# Patient Record
Sex: Female | Born: 1955 | Race: White | Hispanic: No | Marital: Married | Smoking: Never smoker
Health system: Southern US, Community
[De-identification: ages and names within clinical notes are randomized; demographics above are authoritative.]

## PROBLEM LIST (undated history)

## (undated) DIAGNOSIS — I739 Peripheral vascular disease, unspecified: Secondary | ICD-10-CM

## (undated) DIAGNOSIS — E559 Vitamin D deficiency, unspecified: Secondary | ICD-10-CM

## (undated) DIAGNOSIS — T7840XA Allergy, unspecified, initial encounter: Secondary | ICD-10-CM

## (undated) DIAGNOSIS — M8580 Other specified disorders of bone density and structure, unspecified site: Secondary | ICD-10-CM

## (undated) DIAGNOSIS — Z9889 Other specified postprocedural states: Secondary | ICD-10-CM

## (undated) DIAGNOSIS — I639 Cerebral infarction, unspecified: Secondary | ICD-10-CM

## (undated) DIAGNOSIS — E7800 Pure hypercholesterolemia, unspecified: Secondary | ICD-10-CM

## (undated) HISTORY — DX: Other specified postprocedural states: Z98.890

## (undated) HISTORY — DX: Peripheral vascular disease, unspecified: I73.9

## (undated) HISTORY — DX: Allergy, unspecified, initial encounter: T78.40XA

## (undated) HISTORY — DX: Cerebral infarction, unspecified: I63.9

## (undated) HISTORY — DX: Vitamin D deficiency, unspecified: E55.9

## (undated) HISTORY — DX: Pure hypercholesterolemia, unspecified: E78.00

## (undated) HISTORY — DX: Other specified disorders of bone density and structure, unspecified site: M85.80

---

## 2003-08-29 ENCOUNTER — Other Ambulatory Visit: Admission: RE | Admit: 2003-08-29 | Discharge: 2003-08-29 | Payer: Self-pay | Admitting: Family Medicine

## 2004-08-31 ENCOUNTER — Other Ambulatory Visit: Admission: RE | Admit: 2004-08-31 | Discharge: 2004-08-31 | Payer: Self-pay | Admitting: Family Medicine

## 2004-09-22 ENCOUNTER — Encounter: Admission: RE | Admit: 2004-09-22 | Discharge: 2004-09-22 | Payer: Self-pay | Admitting: Family Medicine

## 2004-10-06 ENCOUNTER — Encounter: Admission: RE | Admit: 2004-10-06 | Discharge: 2004-10-06 | Payer: Self-pay | Admitting: Family Medicine

## 2005-09-01 ENCOUNTER — Other Ambulatory Visit: Admission: RE | Admit: 2005-09-01 | Discharge: 2005-09-01 | Payer: Self-pay | Admitting: Family Medicine

## 2005-11-25 ENCOUNTER — Encounter: Admission: RE | Admit: 2005-11-25 | Discharge: 2005-11-25 | Payer: Self-pay | Admitting: Family Medicine

## 2006-09-22 ENCOUNTER — Other Ambulatory Visit: Admission: RE | Admit: 2006-09-22 | Discharge: 2006-09-22 | Payer: Self-pay | Admitting: Family Medicine

## 2007-01-10 ENCOUNTER — Encounter: Admission: RE | Admit: 2007-01-10 | Discharge: 2007-01-10 | Payer: Self-pay | Admitting: Family Medicine

## 2007-05-30 ENCOUNTER — Ambulatory Visit: Payer: Self-pay | Admitting: Sports Medicine

## 2007-05-30 DIAGNOSIS — M67919 Unspecified disorder of synovium and tendon, unspecified shoulder: Secondary | ICD-10-CM | POA: Insufficient documentation

## 2007-05-30 DIAGNOSIS — M719 Bursopathy, unspecified: Secondary | ICD-10-CM

## 2007-05-30 DIAGNOSIS — R109 Unspecified abdominal pain: Secondary | ICD-10-CM

## 2007-05-30 DIAGNOSIS — M25559 Pain in unspecified hip: Secondary | ICD-10-CM | POA: Insufficient documentation

## 2007-06-05 ENCOUNTER — Encounter: Admission: RE | Admit: 2007-06-05 | Discharge: 2007-06-05 | Payer: Self-pay | Admitting: Sports Medicine

## 2007-06-27 ENCOUNTER — Ambulatory Visit: Payer: Self-pay | Admitting: Sports Medicine

## 2007-10-10 ENCOUNTER — Other Ambulatory Visit: Admission: RE | Admit: 2007-10-10 | Discharge: 2007-10-10 | Payer: Self-pay | Admitting: Family Medicine

## 2007-12-12 ENCOUNTER — Encounter: Admission: RE | Admit: 2007-12-12 | Discharge: 2007-12-12 | Payer: Self-pay | Admitting: Family Medicine

## 2009-07-23 ENCOUNTER — Encounter: Admission: RE | Admit: 2009-07-23 | Discharge: 2009-07-23 | Payer: Self-pay | Admitting: Family Medicine

## 2010-01-19 ENCOUNTER — Encounter: Admission: RE | Admit: 2010-01-19 | Discharge: 2010-01-19 | Payer: Self-pay | Admitting: Orthopedic Surgery

## 2010-01-24 ENCOUNTER — Inpatient Hospital Stay (HOSPITAL_COMMUNITY): Admission: EM | Admit: 2010-01-24 | Discharge: 2010-01-26 | Payer: Self-pay | Admitting: Emergency Medicine

## 2010-01-26 ENCOUNTER — Encounter (INDEPENDENT_AMBULATORY_CARE_PROVIDER_SITE_OTHER): Payer: Self-pay | Admitting: Internal Medicine

## 2010-01-28 ENCOUNTER — Encounter
Admission: RE | Admit: 2010-01-28 | Discharge: 2010-04-06 | Payer: Self-pay | Source: Home / Self Care | Admitting: Neurology

## 2010-03-05 ENCOUNTER — Ambulatory Visit: Payer: Self-pay | Admitting: Psychology

## 2010-03-18 ENCOUNTER — Encounter: Payer: Self-pay | Admitting: Cardiology

## 2010-03-20 ENCOUNTER — Encounter: Payer: Self-pay | Admitting: Cardiology

## 2010-03-20 ENCOUNTER — Telehealth (INDEPENDENT_AMBULATORY_CARE_PROVIDER_SITE_OTHER): Payer: Self-pay | Admitting: *Deleted

## 2010-03-20 DIAGNOSIS — I63239 Cerebral infarction due to unspecified occlusion or stenosis of unspecified carotid arteries: Secondary | ICD-10-CM | POA: Insufficient documentation

## 2010-03-20 DIAGNOSIS — I669 Occlusion and stenosis of unspecified cerebral artery: Secondary | ICD-10-CM | POA: Insufficient documentation

## 2010-03-27 ENCOUNTER — Encounter: Payer: Self-pay | Admitting: Cardiology

## 2010-03-27 DIAGNOSIS — I6350 Cerebral infarction due to unspecified occlusion or stenosis of unspecified cerebral artery: Secondary | ICD-10-CM | POA: Insufficient documentation

## 2010-03-30 ENCOUNTER — Telehealth (INDEPENDENT_AMBULATORY_CARE_PROVIDER_SITE_OTHER): Payer: Self-pay | Admitting: *Deleted

## 2010-04-01 ENCOUNTER — Ambulatory Visit: Payer: Self-pay | Admitting: Cardiology

## 2010-04-07 ENCOUNTER — Ambulatory Visit (HOSPITAL_COMMUNITY): Admission: RE | Admit: 2010-04-07 | Discharge: 2010-04-07 | Payer: Self-pay | Admitting: Cardiology

## 2010-04-07 ENCOUNTER — Ambulatory Visit: Payer: Self-pay | Admitting: Cardiovascular Disease

## 2010-05-12 ENCOUNTER — Encounter: Payer: Self-pay | Admitting: Sports Medicine

## 2010-05-15 ENCOUNTER — Ambulatory Visit: Payer: Self-pay | Admitting: Sports Medicine

## 2010-05-15 DIAGNOSIS — M7500 Adhesive capsulitis of unspecified shoulder: Secondary | ICD-10-CM

## 2010-05-15 DIAGNOSIS — M25519 Pain in unspecified shoulder: Secondary | ICD-10-CM

## 2010-06-17 ENCOUNTER — Ambulatory Visit
Admission: RE | Admit: 2010-06-17 | Discharge: 2010-06-17 | Payer: Self-pay | Source: Home / Self Care | Attending: Sports Medicine | Admitting: Sports Medicine

## 2010-07-05 ENCOUNTER — Encounter: Payer: Self-pay | Admitting: Family Medicine

## 2010-07-14 NOTE — Miscellaneous (Signed)
  Clinical Lists Changes  Problems: Added new problem of CVA (ICD-434.91) Orders: Added new Referral order of Holter Monitor (Holter Monitor) - Signed

## 2010-07-14 NOTE — Progress Notes (Signed)
Summary: [facility]  [facility]   Imported By: Marily Memos 05/13/2010 11:39:19  _____________________________________________________________________  External Attachment:    Type:   Image     Comment:   External Document

## 2010-07-14 NOTE — Progress Notes (Signed)
Summary: TEE  lm to cb  Phone Note From Other Clinic   Caller: Nurse Summary of Call: Per Lucendia Herrlich Dr Pearlean Brownie pt needs a TEE scheduled. Please call pt to set this up. Also call Lucendia Herrlich with appt date and tiem [phone] x [phone]. notes to come around from Med Rec  Initial call taken by: Edman Circle,  March 20, 2010 11:02 AM  Follow-up for Phone Call        [facility] on cell# [phone] Mylo Red RN  Pt returned call ,call pt  back at [phone] TEE scheduled with Dr. 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

## 2010-07-14 NOTE — Letter (Signed)
Summary: TEE Instructions  [facility], Main Office  [address]   [place], [place] [address]   Phone: [phone]  Fax: [phone]      TEE Instructions  03/20/2010 MRN: [id number]  HOLLI RENGEL [address] [place], [place]  [address]      You are scheduled for a TEE on October 25,11 with Dr. Jens Som.  Please arrive at the [facility] at 11:00 a.m.  on the day of your procedure.  1)   Diet:     A)   Nothing to eat or drink after midnight except your medications with        a sip of water.  2)  Must have a responsible person to drive you home.  3)   Bring your current insurance cards and current list of all your medications.   *Special Note:  Every effort is made to have your procedure done on time.  Occasionally there are emergencies that present themselves at the hospital that may cause delays.  Please be patient if a delay does occur.  *If you have any questions after you get home, please call the office  at [phone].   Thank you Mylo Red RN

## 2010-07-14 NOTE — Progress Notes (Signed)
Summary: event  monitor  Phone Note Outgoing Call Call back at [facility] Phone [phone] Call back at Work Phone [phone]   Call placed by: Stanton Kidney, EMT-P,  March 30, 2010 11:45 AM Summary of Call: Pt concerned because Dr. Pearlean Brownie advised he would give her a 3 week event monitor, not a 24 hour holter monitor. Pt. advised she was not scheduling for the holter, she would call Dr. Marlis Edelson office, and hopefully fix the confusion. Stanton Kidney, EMT-P  March 30, 2010 11:47 AM  Left message at Dr. Marlis Edelson office if they want event monitor, to pls send order to [place]. Stanton Kidney, EMT-P  March 30, 2010 4:59 PM   Patient's home phone number attempted, # is disconnected. Left message on LM at work #.  Stanton Kidney, EMT-P  April 01, 2010 8:47 AM  Pt. enrolled to have event monitor mailed to pt. Stanton Kidney, EMT-P  April 06, 2010 5:10 PM

## 2010-07-14 NOTE — Procedures (Signed)
Summary: Summary Report  Summary Report   Imported By: Erle Crocker 04/30/2010 13:56:02  _____________________________________________________________________  External Attachment:    Type:   Image     Comment:   External Document

## 2010-07-14 NOTE — Consult Note (Signed)
Summary: [facility] Referral Form   [facility] Referral Form   Imported By: Roderic Ovens 04/13/2010 10:17:57  _____________________________________________________________________  External Attachment:    Type:   Image     Comment:   External Document

## 2010-07-14 NOTE — Assessment & Plan Note (Signed)
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 Now presenting with Lt shoulder pain x 2-3 months, worst with OH movement and reaching back.  Trouble putting coat on.  Did some exercises in occupational rehab, thought was helping initially, but worse over last week.  Pain with sleeping at night. Of note, had Lt sided stroke few months ago of unclear etiology, thinks she has no residual deficits, but does admit to mild diffuse weakness on Lt side.  Preventive Screening-Counseling & Management  Alcohol-Tobacco     Smoking Status: never  Allergies (verified): No Known Drug Allergies  Social History: Smoking Status:  never  Physical Exam  General:  Well-developed,well-nourished,in no acute distress; alert,appropriate and cooperative throughout examination Msk:  Lt Shoulder: Inspection reveals no abnormalities, atrophy or asymmetry. Palpation is normal with no tenderness over AC joint or bicipital groove. ROM decreased F flex 135 deg (Rt 180 deg), Abd 110 deg (Rt 170 deg), ER 45 deg (Rt 75 deg), IR to back pocket (Rt T10) Rotator cuff strength 4/5 diffusely. Grossly positive Neer and Hawkin's tests, neg empty can. Speeds and Yergason's tests normal. + mildly painful arc but no drop arm sign. Neg apprehension  neck: supple, neg Spurling's b/l   MSK Lt shoulder [place]: BT with small proximal tear and mild surrounding fluid.  Subscap, infraspin both intact.  Supraspin without tear but does have obvious calcific bursitis.  Dynamically she does impinge this bursa under acromion  AC joint without arthritis.   Impression & Recommendations:  Problem # 1:   SHOULDER PAIN, LEFT (ICD-719.41)  Has some findings c/w both RC syndrome nad AC, but will need to treat [facility] aggressively first  Orders: [place] LIMITED ([address]109) Joint Aspirate / Injection, Large (20610) Kenalog 10 mg inj (U0454)  Problem # 2:  ADHESIVE CAPSULITIS, LEFT (ICD-726.0)  H/o stroke and limited ROM make this diagnosis clear.  Discussed long time course of recovery from this with patient.  She also denies h/o DM or thyroid disease. - [facility] CSI, see procedure note below - Rest for next few days, then start ROM exercises that were given to her today, see pt instructions - Hold off on RC strength exercises for now until ROM improves - Tylenol as needed for pain (on ASA so can't take NSAIDs) - f/u 1 month  Consent obtained and verified. Sterile betadine prep. Furthur cleansed with alcohol. Topical analgesic spray: Ethyl chloride. Joint: Lt glenohumeral Approached in typical fashion with: posterior approach Completed without difficulty Meds: 1 cc 40 mg kenalog and 6 cc 1% lidocaine Needle: 23 G 1.5 inch Aftercare instructions and Red flags advised.  Orders: Joint Aspirate / Injection, Large (20610) Kenalog 10 mg inj (J3301)  Spent > 25 min with patient, over 50% spent on procedure and also on counseling regarding diagnosis, prognosis, and treatment  Patient Instructions: 1)  Do your wall crawls, pendulum exercises, and other exercises on your sheet. 2)  Please schedule a follow-up appointment in 1 month. 3)  Avoid aggrevating activities to your shoulder, but continue  to move it. 4)  Tylenol for pain as needed, no more than 4000 mg per day.   Orders Added: 1)  Est. Patient Level IV [95621] 2)  [place] LIMITED [76882] 3)  Joint Aspirate / Injection, Large [20610] 4)  Kenalog 10 mg inj [J3301]

## 2010-07-14 NOTE — Op Note (Signed)
Summary: consent  consent   Imported By: Marily Memos 05/18/2010 11:00:32  _____________________________________________________________________  External Attachment:    Type:   Image     Comment:   External Document

## 2010-07-16 NOTE — Assessment & Plan Note (Signed)
Summary: FU APPT/SHOULDER   History of Present Illness: 55 yo F her for f/u shoulder pain.  Received [facility] CSI last visit for [facility], doing 80% better.  Much easier time putting on clothes at this point.  Can lie on Lt side without pain. Occasionally gets some popping, but is non painful. Very pleased with her progress.  Allergies: No Known Drug Allergies  Physical Exam  General:  Well-developed,well-nourished,in no acute distress; alert,appropriate and cooperative throughout examination Msk:  Lt shoulder: ROM F flex 160, abd 160, ER 60 with mild painful catch at extreme of ROM, IR L3. Nl RC strength with IR/ER, mild weakness with empty can.   Impression & Recommendations:  Problem # 1:  ADHESIVE CAPSULITIS, LEFT (ICD-726.0)  Greatly improved, but still has some mild ROM deficits - see pt instructions, continue ROM exercises and add light RC strength exercises - f/u 6 weeks  Orders: Theraband per yard [phone])  Patient Instructions: 1)  Continue wall crawls and pendulums. 2)  Add some walk outs 5-10 times per day. 3)  Grab a door frame and stretch back. 4)  Do 2 sets of 10 daily +of rotator cuff exercises including internal rotation, external rotation, and thumb down raises. 5)  Follow up in 6 weeks.   Orders Added: 1)  Est. Patient Level III [60454] 2)  Theraband per yard [A9300]

## 2010-07-29 ENCOUNTER — Encounter: Payer: Self-pay | Admitting: Sports Medicine

## 2010-07-29 ENCOUNTER — Ambulatory Visit (INDEPENDENT_AMBULATORY_CARE_PROVIDER_SITE_OTHER): Payer: BC Managed Care – PPO | Admitting: Sports Medicine

## 2010-07-29 DIAGNOSIS — M7500 Adhesive capsulitis of unspecified shoulder: Secondary | ICD-10-CM

## 2010-08-05 NOTE — Assessment & Plan Note (Signed)
Summary: FU/MC/MJD   Vital Signs:  Patient profile:   55 year old female Pulse rate:   66 / minute BP sitting:   115 / 76  (right arm)  Vitals Entered By: Rochele Pages RN (July 29, 2010 9:38 AM) CC: f/u lt shoulder pain- 25% improved   CC:  f/u lt shoulder pain- 25% improved.  History of Present Illness: 55 yo F f/u Lt adhesive capsulitis.  S/p [facility] CSI 12/11.  Now 90% better.  Only mild limitation with IR.  Strength almost back to normal.  Has been doing some light theraband as well.  Very pleased. Had small setback a few weeks ago when dog jerked her on the leash, but now better.  Preventive Screening-Counseling & Management  Alcohol-Tobacco     Smoking Status: quit  Allergies: No Known Drug Allergies  Social History: Smoking Status:  quit  Physical Exam  General:  Well-developed,well-nourished,in no acute distress; alert,appropriate and cooperative throughout examination Msk:  Shoulder: Inspection reveals no abnormalities, atrophy or asymmetry. Palpation is normal with no tenderness over AC joint or bicipital groove. ROM 170 deg in f flex and abd, 60 deg ER, T10 on IR Rotator cuff strength 4+/5 (baseline given h/o stroke). No signs of impingement with negative Neer and Hawkin's tests, empty can. Speeds and Yergason's tests normal. No labral pathology noted with negative Obrien's,  No painful arc and no drop arm sign. Neurologic:  alert & oriented X3.     Impression & Recommendations:  Problem # 1:  ADHESIVE CAPSULITIS, LEFT (ICD-726.0) Assessment Improved Greatly improved - continue ROM exercises, add more IR exercises with broom stick pulls - gradually increase her theraband exercises - avoid high risk activities that stress shoulder - f/u as needed or worsening sx   Orders Added: 1)  Est. Patient Level III [04540]

## 2010-08-26 ENCOUNTER — Other Ambulatory Visit (HOSPITAL_COMMUNITY): Payer: Self-pay | Admitting: Neurology

## 2010-08-26 DIAGNOSIS — R51 Headache: Secondary | ICD-10-CM

## 2010-08-28 LAB — URINALYSIS, ROUTINE W REFLEX MICROSCOPIC
Bilirubin Urine: NEGATIVE
Glucose, UA: NEGATIVE mg/dL
Hgb urine dipstick: NEGATIVE
Ketones, ur: NEGATIVE mg/dL
Specific Gravity, Urine: 1.009 (ref 1.005–1.030)
pH: 7 (ref 5.0–8.0)

## 2010-08-28 LAB — LIPID PANEL
LDL Cholesterol: 92 mg/dL (ref 0–99)
Triglycerides: 82 mg/dL (ref <150)

## 2010-08-28 LAB — DIFFERENTIAL
Basophils Absolute: 0 10*3/uL (ref 0.0–0.1)
Basophils Relative: 0 % (ref 0–1)
Neutro Abs: 3 10*3/uL (ref 1.7–7.7)

## 2010-08-28 LAB — BASIC METABOLIC PANEL
CO2: 27 mEq/L (ref 19–32)
Calcium: 9.4 mg/dL (ref 8.4–10.5)
Chloride: 108 mEq/L (ref 96–112)
Creatinine, Ser: 0.65 mg/dL (ref 0.4–1.2)
GFR calc Af Amer: >60 mL/min (ref >60)

## 2010-08-28 LAB — CBC
HCT: 39.4 % (ref 36.0–46.0)
Hemoglobin: 13.7 g/dL (ref 12.0–15.0)
MCHC: 34.8 g/dL (ref 30.0–36.0)
RDW: 12 % (ref 11.5–15.5)

## 2010-08-28 LAB — COMPREHENSIVE METABOLIC PANEL
AST: 25 U/L (ref 0–37)
Albumin: 3.7 g/dL (ref 3.5–5.2)
Calcium: 9.2 mg/dL (ref 8.4–10.5)
Creatinine, Ser: 0.81 mg/dL (ref 0.4–1.2)
Potassium: 3.7 mEq/L (ref 3.5–5.1)
Total Protein: 6.6 g/dL (ref 6.0–8.3)

## 2010-08-28 LAB — GLUCOSE, CAPILLARY
Glucose-Capillary: 97 mg/dL (ref 70–99)
Glucose-Capillary: 109 mg/dL — ABNORMAL HIGH (ref 70–99)
Glucose-Capillary: 111 mg/dL — ABNORMAL HIGH (ref 70–99)
Glucose-Capillary: 113 mg/dL — ABNORMAL HIGH (ref 70–99)
Glucose-Capillary: 99 mg/dL (ref 70–99)

## 2010-08-28 LAB — CK TOTAL AND CKMB (NOT AT ARMC)
CK, MB: 1.3 ng/mL (ref 0.3–4.0)
Total CK: 94 U/L (ref 7–177)

## 2010-08-28 LAB — PROTIME-INR: INR: 0.99 (ref 0.00–1.49)

## 2010-09-01 ENCOUNTER — Ambulatory Visit (HOSPITAL_COMMUNITY)
Admission: RE | Admit: 2010-09-01 | Discharge: 2010-09-01 | Disposition: A | Discharge status: Home / Self Care | Payer: BC Managed Care – PPO | Source: Ambulatory Visit | Attending: Neurology | Admitting: Neurology

## 2010-09-01 ENCOUNTER — Other Ambulatory Visit (HOSPITAL_COMMUNITY): Payer: Self-pay | Admitting: Neurology

## 2010-09-01 DIAGNOSIS — R51 Headache: Secondary | ICD-10-CM

## 2010-09-01 DIAGNOSIS — I672 Cerebral atherosclerosis: Secondary | ICD-10-CM | POA: Insufficient documentation

## 2010-09-01 DIAGNOSIS — Z8673 Personal history of transient ischemic attack (TIA), and cerebral infarction without residual deficits: Secondary | ICD-10-CM | POA: Insufficient documentation

## 2010-09-01 MED ORDER — GADOBENATE DIMEGLUMINE 529 MG/ML IV SOLN
13.0000 mL | Freq: Once | INTRAVENOUS | Status: AC | PRN
  Administered 2010-09-01: 13 mL via INTRAVENOUS

## 2010-10-13 ENCOUNTER — Other Ambulatory Visit: Payer: Self-pay | Admitting: Family Medicine

## 2010-10-13 DIAGNOSIS — Z1231 Encounter for screening mammogram for malignant neoplasm of breast: Secondary | ICD-10-CM

## 2010-10-27 ENCOUNTER — Other Ambulatory Visit: Payer: Self-pay | Admitting: Family Medicine

## 2010-10-27 ENCOUNTER — Ambulatory Visit
Admission: RE | Admit: 2010-10-27 | Discharge: 2010-10-27 | Disposition: A | Discharge status: Home / Self Care | Payer: BC Managed Care – PPO | Source: Ambulatory Visit | Attending: Family Medicine | Admitting: Family Medicine

## 2010-10-27 DIAGNOSIS — N63 Unspecified lump in unspecified breast: Secondary | ICD-10-CM

## 2010-10-27 DIAGNOSIS — Z1231 Encounter for screening mammogram for malignant neoplasm of breast: Secondary | ICD-10-CM

## 2010-11-02 ENCOUNTER — Ambulatory Visit
Admission: RE | Admit: 2010-11-02 | Discharge: 2010-11-02 | Disposition: A | Discharge status: Home / Self Care | Payer: BC Managed Care – PPO | Source: Ambulatory Visit | Attending: Family Medicine | Admitting: Family Medicine

## 2010-11-02 ENCOUNTER — Other Ambulatory Visit: Payer: Self-pay | Admitting: Family Medicine

## 2010-11-02 DIAGNOSIS — N63 Unspecified lump in unspecified breast: Secondary | ICD-10-CM

## 2010-11-13 ENCOUNTER — Other Ambulatory Visit (HOSPITAL_COMMUNITY)
Admission: RE | Admit: 2010-11-13 | Discharge: 2010-11-13 | Disposition: A | Discharge status: Home / Self Care | Payer: BC Managed Care – PPO | Source: Ambulatory Visit | Attending: Family Medicine | Admitting: Family Medicine

## 2010-11-13 ENCOUNTER — Other Ambulatory Visit: Payer: Self-pay | Admitting: Family Medicine

## 2010-11-13 DIAGNOSIS — Z124 Encounter for screening for malignant neoplasm of cervix: Secondary | ICD-10-CM | POA: Insufficient documentation

## 2011-12-14 ENCOUNTER — Other Ambulatory Visit: Payer: Self-pay | Admitting: Family Medicine

## 2011-12-14 DIAGNOSIS — Z1231 Encounter for screening mammogram for malignant neoplasm of breast: Secondary | ICD-10-CM

## 2011-12-15 ENCOUNTER — Ambulatory Visit
Admission: RE | Admit: 2011-12-15 | Discharge: 2011-12-15 | Disposition: A | Discharge status: Home / Self Care | Payer: BC Managed Care – PPO | Source: Ambulatory Visit | Attending: Family Medicine | Admitting: Family Medicine

## 2011-12-15 DIAGNOSIS — Z1231 Encounter for screening mammogram for malignant neoplasm of breast: Secondary | ICD-10-CM

## 2012-02-21 ENCOUNTER — Other Ambulatory Visit: Payer: Self-pay | Admitting: Family Medicine

## 2013-01-31 ENCOUNTER — Other Ambulatory Visit: Payer: Self-pay

## 2013-01-31 DIAGNOSIS — Z1231 Encounter for screening mammogram for malignant neoplasm of breast: Secondary | ICD-10-CM

## 2013-02-28 ENCOUNTER — Ambulatory Visit
Admission: RE | Admit: 2013-02-28 | Discharge: 2013-02-28 | Disposition: A | Discharge status: Home / Self Care | Payer: BC Managed Care – PPO | Source: Ambulatory Visit

## 2013-02-28 DIAGNOSIS — Z1231 Encounter for screening mammogram for malignant neoplasm of breast: Secondary | ICD-10-CM

## 2014-02-14 ENCOUNTER — Other Ambulatory Visit (HOSPITAL_COMMUNITY)
Admission: RE | Admit: 2014-02-14 | Discharge: 2014-02-14 | Disposition: A | Discharge status: Home / Self Care | Payer: BC Managed Care – PPO | Source: Ambulatory Visit | Attending: Family Medicine | Admitting: Family Medicine

## 2014-02-14 ENCOUNTER — Other Ambulatory Visit: Payer: Self-pay | Admitting: Family Medicine

## 2014-02-14 DIAGNOSIS — Z124 Encounter for screening for malignant neoplasm of cervix: Secondary | ICD-10-CM | POA: Insufficient documentation

## 2014-02-15 LAB — CYTOLOGY - PAP

## 2015-02-26 ENCOUNTER — Other Ambulatory Visit: Payer: Self-pay

## 2015-02-26 DIAGNOSIS — Z1231 Encounter for screening mammogram for malignant neoplasm of breast: Secondary | ICD-10-CM

## 2015-03-05 ENCOUNTER — Ambulatory Visit
Admission: RE | Admit: 2015-03-05 | Discharge: 2015-03-05 | Disposition: A | Discharge status: Home / Self Care | Payer: BC Managed Care – PPO | Source: Ambulatory Visit

## 2015-03-05 DIAGNOSIS — Z1231 Encounter for screening mammogram for malignant neoplasm of breast: Secondary | ICD-10-CM

## 2016-03-30 ENCOUNTER — Other Ambulatory Visit: Payer: Self-pay | Admitting: Family Medicine

## 2016-03-30 DIAGNOSIS — Z1231 Encounter for screening mammogram for malignant neoplasm of breast: Secondary | ICD-10-CM

## 2016-04-05 ENCOUNTER — Ambulatory Visit
Admission: RE | Admit: 2016-04-05 | Discharge: 2016-04-05 | Disposition: A | Discharge status: Home / Self Care | Payer: BC Managed Care – PPO | Source: Ambulatory Visit | Attending: Family Medicine | Admitting: Family Medicine

## 2016-04-05 DIAGNOSIS — Z1231 Encounter for screening mammogram for malignant neoplasm of breast: Secondary | ICD-10-CM

## 2017-02-22 ENCOUNTER — Encounter (INDEPENDENT_AMBULATORY_CARE_PROVIDER_SITE_OTHER): Payer: Self-pay

## 2017-02-22 ENCOUNTER — Encounter: Payer: Self-pay | Admitting: Sports Medicine

## 2017-02-22 ENCOUNTER — Ambulatory Visit (INDEPENDENT_AMBULATORY_CARE_PROVIDER_SITE_OTHER): Payer: BC Managed Care – PPO | Admitting: Sports Medicine

## 2017-02-22 DIAGNOSIS — R269 Unspecified abnormalities of gait and mobility: Secondary | ICD-10-CM | POA: Insufficient documentation

## 2017-02-22 NOTE — Assessment & Plan Note (Signed)
With sports insole/ heel wedge medially and scaphoid pad we corrected 90% of pronation Patient's left foot turnout was much less/ still about 10 deg on RT  This felt comfortable  Trail 6 wks Then consider custom orthotics for long term running/hiking

## 2017-02-22 NOTE — Progress Notes (Signed)
Subjective:    Patient ID: Jamie Lambert, female    DOB: 1956/05/08, 61 y.o.   MRN: [id number]  Patient is a 61 year old female who appears to the clinic with the chief complaint of possible left foot stress fracture.  She states that approximately 2 months ago she was hiking on the beach in [place] and noticed sudden onset pain at the had of her first MTP joint. Shortly afterwards, she did have limping and eventually had to stop hiking secondary to pain. She tried resting without any significant exercise or running for approximately 5-6 weeks with improvement in her overall pain.   Currently she is not in any foot pain today however she is questioning on whether or not she is able to go back to her weekly running routine of approximately 3 miles 3-5 times per day. She states that walking uphill or prolonged distances make her symptoms worse. Other associated symptoms include: The development of right-sided hip pain and occasional groin pain.   She has no known history of osteoarthritis. She states that she has recently obtained new running shoes however she does not like them because they hurt her feet. Denies any significant numbness or tingling of her legs or feet. States she never did get x-rays on her feet to confirm possible fracture nor did she see a provider.      Review of Systems  Musculoskeletal: Positive for arthralgias and joint swelling. Negative for back pain, gait problem and myalgias.       Positive right groin pain  Skin: Negative for color change.  Neurological: Negative for weakness and numbness.       No neuropathy       Objective:   Physical Exam  Musculoskeletal: She exhibits deformity (Calcaneal valgus deformity with increased medial arch left greater than right). She exhibits no edema or tenderness.  Upon inspection, the deformity as previously stated is noted over feet. Upon ambulation, she does have some swelling of her left foot without correction as well  as some subtalar medial subluxation. She does have a high riding medial arch with significant callus formation of her forefoot on the left. She has no significant tenderness to palpation of her ankle or foot. She has normal range of motion in plantarflexion and dorsiflexion on the left foot. Strength testing is +5 out of 5 in ankle dorsiflexion and 5 out of 5 in ankle plantar flexion. Her extensor hallucis longus is intact as well as flexor hallucis longus. Dorsalis pedis pulse was 2 out of 4 bilaterally. Sensation is intact to light touch L4-S1 bilaterally. Her reflexes L4 is +2 out of 4, S1 +2 out of 4.  Skin: Skin is warm. No erythema.  Nursing note and vitals reviewed.         Assessment & Plan:  1. Subtalar subluxation of the left foot 2. Calcaneal valgus deformity  After visualizing the anatomy of the patient's feet as well as her gait, I am recommending a temporary sports insole modified inserts to help correction of her subluxation. I did place a scaphoid pad on a temporary orthotic for her shoe as well as a medial heel wedge for her calcaneus.  Given her age and compensatory symptoms of intermittent right-sided groin pain, I do think she would be a good candidate for a custom orthotic. Will trial these temporary orthotics in her shoes for the next 6 weeks. If her symptoms are significantly improving, I am recommending that she call the office to obtain a more  custom orthotic. Furthermore, I did give her patient education on hamstring strengthening exercises. These are to be completed twice daily for the next 4-6 weeks to help with strengthening the overall hip and knee. She may proceed with activity as tolerated. Patient is agreeable to this plan.

## 2017-02-28 ENCOUNTER — Other Ambulatory Visit: Payer: Self-pay | Admitting: Family Medicine

## 2017-02-28 DIAGNOSIS — Z1239 Encounter for other screening for malignant neoplasm of breast: Secondary | ICD-10-CM

## 2017-04-05 ENCOUNTER — Encounter: Payer: Self-pay | Admitting: Sports Medicine

## 2017-04-05 ENCOUNTER — Ambulatory Visit (INDEPENDENT_AMBULATORY_CARE_PROVIDER_SITE_OTHER): Payer: BC Managed Care – PPO | Admitting: Sports Medicine

## 2017-04-05 VITALS — BP 104/70 | Ht 67.0 in | Wt 140.0 lb

## 2017-04-05 DIAGNOSIS — R269 Unspecified abnormalities of gait and mobility: Secondary | ICD-10-CM

## 2017-04-05 NOTE — Progress Notes (Signed)
Subjective: Jamie Lambert is a 61 year old female who presents to the sports medicine office today for follow-up of left foot pain. At last office appointment she was diagnosed with subtalar subluxation of the left foot and had bilateral calcaneal valgus deformity.  She was fitted for green insoles with scaphoid paddingand medial heel wedge bilaterally.  She reports that after she bought a new pair of tennis shoes her foot pain essentially went away.  She reports of occasional dorsal midfoot pain with deep palpation, but does not have any pain while walking.  She does not report of any numbness, tingling, or burning paresthesias.  She has not taken anything for pain. She has been running and has not reported of any issues.  Objective:  Gen: The patient appears healthy no acute distress alert and oriented.  HEENT: Moist oral mucosa  Resp: Normal respirations Cards: Normal peripheral pulses Skin: No rashes on visible skin Psych: Mood is described as good MSK: Inspection of her left foot reveals no obvious deformity or muscle atrophy, no warmth, erythema, ecchymosis, or effusion, she is tightly tender to deep palpation over the midfoot on left foot, no pain over Lisfranc,navicular or base of fifth metatarsal, she does have calcaneal valgus approximately 5 on the right, 10 on the left, with dynamic pronation noted  Assessment: 1. Abnormality of gait with bilateral calcaneal valgus, left worse than right 2. Subtalar subluxation of the left foot  Patient was fitted for a : standard, cushioned, semi-rigid orthotic. The orthotic was heated and afterward the patient stood on the orthotic blank positioned on the orthotic stand. The patient was positioned in subtalar neutral position and 10 degrees of ankle dorsiflexion in a weight bearing stance. After completion of molding, a stable base was applied to the orthotic blank. The blank was ground to a stable position for weight bearing. Size: 8 Base: Blue  EVA Posting: None Additional orthotic padding: Medial Heel wedge B/L  Greater than 30 minutes was spent, with at least 50% of time spent in discussion regarding custom orthotics and making orthotics to her satisfaction.  Plan: Custom orthotics were made for her today to her satisfaction. Do feel that these custom orthotics will help with herleft foot subtalar subluxation in bilateral calcaneal valgus.  She will follow up here on as-needed basis.  Jamie Kernshristopher Lake, MD Primary Care Sports Medicine Fellow [facility]

## 2017-04-13 ENCOUNTER — Ambulatory Visit
Admission: RE | Admit: 2017-04-13 | Discharge: 2017-04-13 | Disposition: A | Discharge status: Home / Self Care | Payer: BC Managed Care – PPO | Source: Ambulatory Visit | Attending: Family Medicine | Admitting: Family Medicine

## 2017-04-13 DIAGNOSIS — Z1239 Encounter for other screening for malignant neoplasm of breast: Secondary | ICD-10-CM

## 2017-07-21 ENCOUNTER — Other Ambulatory Visit: Payer: Self-pay | Admitting: Family Medicine

## 2017-07-21 ENCOUNTER — Other Ambulatory Visit (HOSPITAL_COMMUNITY)
Admission: RE | Admit: 2017-07-21 | Discharge: 2017-07-21 | Disposition: A | Discharge status: Home / Self Care | Payer: BC Managed Care – PPO | Source: Ambulatory Visit | Attending: Family Medicine | Admitting: Family Medicine

## 2017-07-21 DIAGNOSIS — Z124 Encounter for screening for malignant neoplasm of cervix: Secondary | ICD-10-CM | POA: Diagnosis present

## 2017-07-25 LAB — CYTOLOGY - PAP: Diagnosis: NEGATIVE

## 2017-10-19 ENCOUNTER — Encounter: Payer: Self-pay | Admitting: Family Medicine

## 2017-10-19 ENCOUNTER — Ambulatory Visit (INDEPENDENT_AMBULATORY_CARE_PROVIDER_SITE_OTHER): Payer: BC Managed Care – PPO | Admitting: Family Medicine

## 2017-10-19 DIAGNOSIS — M79672 Pain in left foot: Secondary | ICD-10-CM | POA: Diagnosis not present

## 2017-10-19 DIAGNOSIS — M79671 Pain in right foot: Secondary | ICD-10-CM

## 2017-10-19 NOTE — Progress Notes (Signed)
PCP: Blair Heys, MD  Subjective:   HPI: Patient is a 62 y.o. female here for custom orthotics.  04/05/17: Jamie Lambert is a 62 year old female who presents to the sports medicine office today for follow-up of left foot pain. At last office appointment she was diagnosed with subtalar subluxation of the left foot and had bilateral calcaneal valgus deformity.  She was fitted for green insoles with scaphoid paddingand medial heel wedge bilaterally.  She reports that after she bought a new pair of tennis shoes her foot pain essentially went away.  She reports of occasional dorsal midfoot pain with deep palpation, but does not have any pain while walking.  She does not report of any numbness, tingling, or burning paresthesias.  She has not taken anything for pain. She has been running and has not reported of any issues.   10/19/17: Patient returns reporting she's doing well. Really likes the custom orthotics she has and would like another pair. Not currently having any pain. Has sports insoles as well and feels these have worn down. Gets a little midfoot pain at times on the left side. No new complaints, skin changes.  Past Medical History:  Diagnosis Date  . Allergy   . History of colonoscopy    adenomatous colon polyp (colonoscopy 11/2007)  . Hypercholesteremia   . Osteopenia   . 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 Vitamin D deficiency     Current Outpatient Medications on File Prior to Visit  Medication Sig Dispense Refill  . aspirin 325 MG EC tablet Take 325 mg by mouth daily.    . beta carotene w/minerals (OCUVITE) tablet Take 1 tablet by mouth daily.    . diphenhydrAMINE (BENADRYL) 25 mg capsule Take 25 mg by mouth every 6 (six) hours as needed.    . furosemide (LASIX) 20 MG tablet Take 20 mg by mouth 2 (two) times daily.    . metoprolol succinate (TOPROL-XL) 100 MG 24 hr tablet Take 100 mg by mouth 2 (two) times daily. Take with or immediately following a meal.    .  Multiple Vitamin (MULTIVITAMIN) capsule Take 1 capsule by mouth daily.    . ramipril (ALTACE) 10 MG capsule Take 10 mg by mouth daily.     No current facility-administered medications on file prior to visit.     No past surgical history on file.  Allergies  Allergen Reactions  . Prednisone     Social History   Socioeconomic History  . Marital status: Married    Spouse name: Not on file  . Number of children: Not on file  . Years of education: Not on file  . Highest education level: Not on file  Occupational History  . Not on file  Social Needs  . Financial resource strain: Not on file  . Food insecurity:    Worry: Not on file    Inability: Not on file  . Transportation needs:    Medical: Not on file    Non-medical: Not on file  Tobacco Use  . Smoking status: Never Smoker  . Smokeless tobacco: Never Used  Substance and Sexual Activity  . Alcohol use: Not on file  . Drug use: Not on file  . Sexual activity: Not on file  Lifestyle  . Physical activity:    Days per week: Not on file    Minutes per session: Not on file  . Stress: Not on file  Relationships  . Social connections:  Talks on phone: Not on file    Gets together: Not on file    Attends religious service: Not on file    Active member of club or organization: Not on file    Attends meetings of clubs or organizations: Not on file    Relationship status: Not on file  . Intimate partner violence:    Fear of current or ex partner: Not on file    Emotionally abused: Not on file    Physically abused: Not on file    Forced sexual activity: Not on file  Other Topics Concern  . Not on file  Social History Narrative  . Not on file    No family history on file.  BP 110/70   Ht 5' 7.5" (1.715 m)   Wt 140 lb (63.5 kg)   BMI 21.60 kg/m   Review of Systems: See HPI above.     Objective:  Physical Exam:  Gen: NAD, comfortable in exam room  Left foot/ankle: No leg length inequality. Calcaneal  valgus.  No hallux rigidus.  Transverse arch collapse. No other gross deformity, swelling, ecchymoses FROM with 5/5 strength. TTP minimally at TMT joint.  No other tenderness. Negative metatarsal squeeze. Negative ant drawer and talar tilt.   Negative syndesmotic compression. Thompsons test negative. NV intact distally.  Right foot/ankle: Calcaneal valgus.  No hallux rigidus.  Transverse arch collapse. No other gross deformity, swelling, ecchymoses FROM with 5/5 strength. No TTP. Negative metatarsal squeeze. Negative ant drawer and talar tilt.   Negative syndesmotic compression. Thompsons test negative. NV intact distally.   Assessment & Plan:  1. Bilateral foot pain - midfoot arthropathy, subtalar subluxation with calcaneal valgus.  Done well with custom orthotics and new pair made today.    Patient was fitted for a : standard, cushioned, semi-rigid orthotic. The orthotic was heated and afterward the patient stood on the orthotic blank positioned on the orthotic stand. The patient was positioned in subtalar neutral position and 10 degrees of ankle dorsiflexion in a weight bearing stance. After completion of molding, a stable base was applied to the orthotic blank. The blank was ground to a stable position for weight bearing. Size: 7 red cambray Base: blue med density eva Posting: none Additional orthotic padding: medial heel wedges Total prep time 30 minutes - > 50% of which spent on counseling, answering questions.

## 2017-10-19 NOTE — Assessment & Plan Note (Signed)
midfoot arthropathy, subtalar subluxation with calcaneal valgus.  Done well with custom orthotics and new pair made today.    Patient was fitted for a : standard, cushioned, semi-rigid orthotic. The orthotic was heated and afterward the patient stood on the orthotic blank positioned on the orthotic stand. The patient was positioned in subtalar neutral position and 10 degrees of ankle dorsiflexion in a weight bearing stance. After completion of molding, a stable base was applied to the orthotic blank. The blank was ground to a stable position for weight bearing. Size: 7 red cambray Base: blue med density eva Posting: none Additional orthotic padding: medial heel wedges Total prep time 30 minutes - > 50% of which spent on counseling, answering questions.

## 2018-03-08 ENCOUNTER — Other Ambulatory Visit: Payer: Self-pay | Admitting: Family Medicine

## 2018-03-08 DIAGNOSIS — Z1231 Encounter for screening mammogram for malignant neoplasm of breast: Secondary | ICD-10-CM

## 2018-04-04 ENCOUNTER — Other Ambulatory Visit: Payer: Self-pay | Admitting: Family Medicine

## 2018-04-04 DIAGNOSIS — R51 Headache: Principal | ICD-10-CM

## 2018-04-04 DIAGNOSIS — R519 Headache, unspecified: Secondary | ICD-10-CM

## 2018-04-09 ENCOUNTER — Other Ambulatory Visit: Payer: Self-pay

## 2018-04-09 ENCOUNTER — Encounter (HOSPITAL_COMMUNITY): Payer: Self-pay | Admitting: Emergency Medicine

## 2018-04-09 ENCOUNTER — Inpatient Hospital Stay (HOSPITAL_COMMUNITY)
Admission: EM | Admit: 2018-04-09 | Discharge: 2018-04-10 | DRG: 042 | Disposition: A | Discharge status: Home / Self Care | Payer: BC Managed Care – PPO | Attending: Internal Medicine | Admitting: Internal Medicine

## 2018-04-09 ENCOUNTER — Ambulatory Visit
Admission: RE | Admit: 2018-04-09 | Discharge: 2018-04-09 | Disposition: A | Discharge status: Home / Self Care | Payer: BC Managed Care – PPO | Source: Ambulatory Visit | Attending: Family Medicine | Admitting: Family Medicine

## 2018-04-09 ENCOUNTER — Inpatient Hospital Stay (HOSPITAL_COMMUNITY): Payer: BC Managed Care – PPO

## 2018-04-09 DIAGNOSIS — I63432 Cerebral infarction due to embolism of left posterior cerebral artery: Secondary | ICD-10-CM | POA: Diagnosis present

## 2018-04-09 DIAGNOSIS — I6300 Cerebral infarction due to thrombosis of unspecified precerebral artery: Secondary | ICD-10-CM | POA: Diagnosis not present

## 2018-04-09 DIAGNOSIS — I10 Essential (primary) hypertension: Secondary | ICD-10-CM | POA: Diagnosis present

## 2018-04-09 DIAGNOSIS — Z8249 Family history of ischemic heart disease and other diseases of the circulatory system: Secondary | ICD-10-CM

## 2018-04-09 DIAGNOSIS — R202 Paresthesia of skin: Secondary | ICD-10-CM | POA: Diagnosis present

## 2018-04-09 DIAGNOSIS — R0789 Other chest pain: Secondary | ICD-10-CM

## 2018-04-09 DIAGNOSIS — Z79899 Other long term (current) drug therapy: Secondary | ICD-10-CM | POA: Diagnosis not present

## 2018-04-09 DIAGNOSIS — I639 Cerebral infarction, unspecified: Secondary | ICD-10-CM | POA: Diagnosis present

## 2018-04-09 DIAGNOSIS — I6389 Other cerebral infarction: Secondary | ICD-10-CM | POA: Diagnosis not present

## 2018-04-09 DIAGNOSIS — I63412 Cerebral infarction due to embolism of left middle cerebral artery: Principal | ICD-10-CM | POA: Diagnosis present

## 2018-04-09 DIAGNOSIS — E7800 Pure hypercholesterolemia, unspecified: Secondary | ICD-10-CM | POA: Diagnosis present

## 2018-04-09 DIAGNOSIS — R29700 NIHSS score 0: Secondary | ICD-10-CM | POA: Diagnosis present

## 2018-04-09 DIAGNOSIS — E785 Hyperlipidemia, unspecified: Secondary | ICD-10-CM | POA: Diagnosis present

## 2018-04-09 DIAGNOSIS — R51 Headache: Principal | ICD-10-CM

## 2018-04-09 DIAGNOSIS — Z888 Allergy status to other drugs, medicaments and biological substances status: Secondary | ICD-10-CM

## 2018-04-09 DIAGNOSIS — R519 Headache, unspecified: Secondary | ICD-10-CM

## 2018-04-09 DIAGNOSIS — I369 Nonrheumatic tricuspid valve disorder, unspecified: Secondary | ICD-10-CM | POA: Diagnosis not present

## 2018-04-09 LAB — COMPREHENSIVE METABOLIC PANEL
ALBUMIN: 3.7 g/dL (ref 3.5–5.0)
ALK PHOS: 63 U/L (ref 38–126)
ALT: 20 U/L (ref 0–44)
ANION GAP: 5 (ref 5–15)
AST: 23 U/L (ref 15–41)
BILIRUBIN TOTAL: 0.7 mg/dL (ref 0.3–1.2)
BUN: 12 mg/dL (ref 8–23)
CALCIUM: 9.4 mg/dL (ref 8.9–10.3)
CO2: 27 mmol/L (ref 22–32)
CREATININE: 0.77 mg/dL (ref 0.44–1.00)
Chloride: 108 mmol/L (ref 98–111)
Glucose, Bld: 106 mg/dL — ABNORMAL HIGH (ref 70–99)
Potassium: 4.4 mmol/L (ref 3.5–5.1)
Sodium: 140 mmol/L (ref 135–145)
Total Protein: 6.5 g/dL (ref 6.5–8.1)

## 2018-04-09 LAB — APTT: APTT: 29 s (ref 24–36)

## 2018-04-09 LAB — PROTIME-INR
INR: 0.99
PROTHROMBIN TIME: 13 s (ref 11.4–15.2)

## 2018-04-09 LAB — DIFFERENTIAL
Abs Immature Granulocytes: 0 10*3/uL (ref 0.00–0.07)
Basophils Absolute: 0.1 10*3/uL (ref 0.0–0.1)
Basophils Relative: 1 %
EOS ABS: 0.3 10*3/uL (ref 0.0–0.5)
EOS PCT: 7 %
Immature Granulocytes: 0 %
LYMPHS ABS: 2.1 10*3/uL (ref 0.7–4.0)
LYMPHS PCT: 48 %
MONO ABS: 0.5 10*3/uL (ref 0.1–1.0)
MONOS PCT: 10 %
Neutro Abs: 1.5 10*3/uL — ABNORMAL LOW (ref 1.7–7.7)
Neutrophils Relative %: 34 %

## 2018-04-09 LAB — CBC
HCT: 40.7 % (ref 36.0–46.0)
Hemoglobin: 12.9 g/dL (ref 12.0–15.0)
MCH: 29.5 pg (ref 26.0–34.0)
MCHC: 31.7 g/dL (ref 30.0–36.0)
MCV: 92.9 fL (ref 80.0–100.0)
PLATELETS: 180 10*3/uL (ref 150–400)
RBC: 4.38 MIL/uL (ref 3.87–5.11)
RDW: 11.6 % (ref 11.5–15.5)
WBC: 4.4 10*3/uL (ref 4.0–10.5)
nRBC: 0 % (ref 0.0–0.2)

## 2018-04-09 LAB — CBG MONITORING, ED: Glucose-Capillary: 89 mg/dL (ref 70–99)

## 2018-04-09 MED ORDER — MULTIVITAMINS PO CAPS: 1.0000 | ORAL_CAPSULE | Freq: Every day | ORAL | Status: DC

## 2018-04-09 MED ORDER — PROSIGHT PO TABS
1.0000 | ORAL_TABLET | Freq: Every day | ORAL | Status: DC
  Administered 2018-04-09 – 2018-04-10 (×2): 1 via ORAL
  Filled 2018-04-09 (×2): qty 1

## 2018-04-09 MED ORDER — IOPAMIDOL (ISOVUE-370) INJECTION 76%
INTRAVENOUS | Status: AC
  Administered 2018-04-09: 50 mL
  Filled 2018-04-09: qty 50

## 2018-04-09 MED ORDER — ADULT MULTIVITAMIN W/MINERALS CH
1.0000 | ORAL_TABLET | Freq: Every day | ORAL | Status: DC
  Administered 2018-04-09 – 2018-04-10 (×2): 1 via ORAL
  Filled 2018-04-09 (×2): qty 1

## 2018-04-09 MED ORDER — OCUVITE PO TABS: 1.0000 | ORAL_TABLET | Freq: Every day | ORAL | Status: DC

## 2018-04-09 MED ORDER — STROKE: EARLY STAGES OF RECOVERY BOOK
Freq: Once | Status: AC
  Administered 2018-04-09: 13:00:00
  Filled 2018-04-09: qty 1

## 2018-04-09 MED ORDER — ACETAMINOPHEN 325 MG PO TABS
650.0000 mg | ORAL_TABLET | ORAL | Status: DC | PRN
  Administered 2018-04-09: 650 mg via ORAL
  Filled 2018-04-09: qty 2

## 2018-04-09 MED ORDER — ACETAMINOPHEN 650 MG RE SUPP: 650.0000 mg | RECTAL | Status: DC | PRN

## 2018-04-09 MED ORDER — ACETAMINOPHEN 160 MG/5ML PO SOLN: 650.0000 mg | ORAL | Status: DC | PRN

## 2018-04-09 MED ORDER — ACETAMINOPHEN 325 MG PO TABS: 325.0000 mg | ORAL_TABLET | Freq: Four times a day (QID) | ORAL | Status: DC | PRN

## 2018-04-09 MED ORDER — ASPIRIN EC 81 MG PO TBEC
81.0000 mg | DELAYED_RELEASE_TABLET | Freq: Every day | ORAL | Status: DC
  Administered 2018-04-10: 81 mg via ORAL
  Filled 2018-04-09: qty 1

## 2018-04-09 MED ORDER — ENOXAPARIN SODIUM 40 MG/0.4ML ~~LOC~~ SOLN
40.0000 mg | Freq: Every day | SUBCUTANEOUS | Status: DC
  Administered 2018-04-09: 40 mg via SUBCUTANEOUS
  Filled 2018-04-09 (×2): qty 0.4

## 2018-04-09 NOTE — ED Notes (Signed)
ED Provider at bedside. 

## 2018-04-09 NOTE — Progress Notes (Signed)
New Admission Note:  Arrival Method: in wheelchair from ER Mental Orientation: Alert & oriented x4 Telemetry:3w07 Assessment: Completed Skin:intact  Pain:0/10 Safety Measures: Safety Fall Prevention Plan was given, discussed. 4U98: Patient has been orientated to the room, unit and the staff. Family:husband at bedside  Orders have been reviewed and implemented. Will continue to monitor the patient. Call light has been placed within reach and bed alarm has been activated.   Lawernce Ion ,RN

## 2018-04-09 NOTE — ED Triage Notes (Signed)
Pt. Stated, Ive had headaches for 6 weeks and numbness on 2 fingers on left head . Dr. Izola Price (on call for [facility] ) HAD a  MRI this morning and it showed a left side hemisphere stroke. Sent [place] here for further evaluation

## 2018-04-09 NOTE — ED Provider Notes (Signed)
[facility] EMERGENCY DEPARTMENT Provider Note   CSN: [id number] Arrival date & time: 04/09/18  1017     History   Chief Complaint Chief Complaint  Patient presents with  . Cerebrovascular Accident    for 6 weeks    HPI Jamie Lambert is a 62 y.o. female with history of stroke in 2011, hypercholesterolemia who presents with a one-month history of intermittent left frontal headaches and numbness of her fourth and fifth digit on her right.  These 2 symptoms do not seem to be associated to the patient.  She also had a dizzy spell 1 week ago.  Patient had been thinking it was related to her sinuses and was taking Claritin, however was not helping.  She has also taken Tylenol couple times.  She saw her primary care provider 1 week ago who ordered an MRI which was conducted today and found areas of ischemia in the left hemisphere.  Patient was sent here for further evaluation by neurology.  Patient denies any chest pain, shortness of breath, abdominal pain, nausea, vomiting, urinary symptoms, current dizziness or lightheadedness, or vision changes.  Patient is not anticoagulated.  HPI  Past Medical History:  Diagnosis Date  . Allergy   . History of colonoscopy    adenomatous colon polyp (colonoscopy 11/2007)  . Hypercholesteremia   . Osteopenia   . 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 Vitamin D deficiency     Patient Active Problem List   Diagnosis Date Noted  . CVA (cerebral vascular accident) (HCC) 04/09/2018  . Bilateral foot pain 10/19/2017  . Abnormality of gait 02/22/2017  . SHOULDER PAIN, LEFT 05/15/2010  . ADHESIVE CAPSULITIS, LEFT 05/15/2010  . CVA 03/27/2010  . CAROTID ARTERY OCCLUSION, WITH INFARCTION 03/20/2010  . CEREBRAL EMBOLISM WITHOUT MENTION INFARCT 03/20/2010  . HIP PAIN, RIGHT 05/30/2007  . Disorders of bursae and tendons in shoulder region, unspecified 05/30/2007  . PELVIC  PAIN 05/30/2007    History reviewed. No  pertinent surgical history.   OB History   None      Home Medications    Prior to Admission medications   Medication Sig Start Date End Date Taking? Authorizing Provider  acetaminophen (TYLENOL) 325 MG tablet Take 325 mg by mouth every 6 (six) hours as needed for mild pain.    Yes [provider]  beta carotene w/minerals (OCUVITE) tablet Take 1 tablet by mouth daily.   Yes [provider]  diazepam (VALIUM) 5 MG tablet Take 5-10 mg by mouth See admin instructions. Take 1 to 2 tablets by mouth 30-60 minutes before MRI   Yes [provider]  Multiple Vitamin (MULTIVITAMIN) capsule Take 1 capsule by mouth daily.   Yes [provider]    Family History No family history on file.  Social History Social History   Tobacco Use  . Smoking status: Never Smoker  . Smokeless tobacco: Never Used  Substance Use Topics  . Alcohol use: Not on file  . Drug use: Not on file     Allergies   Prednisone   Review of Systems Review of Systems  Constitutional: Negative for chills and fever.  HENT: Negative for facial swelling and sore throat.   Respiratory: Negative for shortness of breath.   Cardiovascular: Negative for chest pain.  Gastrointestinal: Negative for abdominal pain, nausea and vomiting.  Genitourinary: Negative for dysuria.  Musculoskeletal: Negative for back pain.  Skin: Negative for rash and wound.  Neurological:  Positive for numbness and headaches.  Psychiatric/Behavioral: The patient is not nervous/anxious.      Physical Exam Updated Vital Signs BP (!) 164/63   Pulse (!) 48   Temp 97.8 F (36.6 C) (Oral)   Resp 16   Ht 5\' 7"  (1.702 m)   Wt 63.5 kg   SpO2 100%   BMI 21.93 kg/m   Physical Exam  Constitutional: She appears well-developed and well-nourished. No distress.  HENT:  Head: Normocephalic and atraumatic.  Mouth/Throat: Oropharynx is clear and moist. No oropharyngeal exudate.  Eyes: Pupils are equal, round, and  reactive to light. Conjunctivae and EOM are normal. Right eye exhibits no discharge. Left eye exhibits no discharge. No scleral icterus.  Neck: Normal range of motion. Neck supple. No thyromegaly present.  Cardiovascular: Normal rate, regular rhythm, normal heart sounds and intact distal pulses. Exam reveals no gallop and no friction rub.  No murmur heard. Pulmonary/Chest: Effort normal and breath sounds normal. No stridor. No respiratory distress. She has no wheezes. She has no rales.  Abdominal: Soft. Bowel sounds are normal. She exhibits no distension. There is no tenderness. There is no rebound and no guarding.  Musculoskeletal: She exhibits no edema.  Lymphadenopathy:    She has no cervical adenopathy.  Neurological: She is alert. Coordination normal.  CN 3-12 intact; normal sensation throughout; 5/5 strength in all 4 extremities; equal bilateral grip strength; negative Romberg  Skin: Skin is warm and dry. No rash noted. She is not diaphoretic. No pallor.  Psychiatric: She has a normal mood and affect.  Nursing note and vitals reviewed.    ED Treatments / Results  Labs (all labs ordered are listed, but only abnormal results are displayed) Labs Reviewed  DIFFERENTIAL - Abnormal; Notable for the following components:      Result Value   Neutro Abs 1.5 (*)    All other components within normal limits  COMPREHENSIVE METABOLIC PANEL - Abnormal; Notable for the following components:   Glucose, Bld 106 (*)    All other components within normal limits  PROTIME-INR  APTT  CBC  HIV ANTIBODY (ROUTINE TESTING W REFLEX)  CBG MONITORING, ED    EKG None  Radiology Ct Angio Head W Or Wo Contrast  Result Date: 04/09/2018 CLINICAL DATA:  Headaches and numbness of the left face and hand. Recent MRI showing left hemisphere infarct. EXAM: CT ANGIOGRAPHY HEAD AND NECK TECHNIQUE: Multidetector CT imaging of the head and neck was performed using the standard protocol during bolus  administration of intravenous contrast. Multiplanar CT image reconstructions and MIPs were obtained to evaluate the vascular anatomy. Carotid stenosis measurements (when applicable) are obtained utilizing NASCET criteria, using the distal internal carotid diameter as the denominator. CONTRAST:  50mL ISOVUE-370 IOPAMIDOL (ISOVUE-370) INJECTION 76% COMPARISON:  01/26/2010 CTA head neck FINDINGS: CT HEAD FINDINGS Brain: There is no mass, hemorrhage or extra-axial collection. The size and configuration of the ventricles and extra-axial CSF spaces are normal. There is an old right frontal lobe infarct. No evidence of acute infarct. The brain parenchyma is normal. Skull: The visualized skull base, calvarium and extracranial soft tissues are normal. Sinuses/Orbits: Partial opacification of the ethmoid air cells and left maxillary sinus. The orbits are normal. CTA NECK FINDINGS SKELETON: There is no bony spinal canal stenosis. No lytic or blastic lesion. OTHER NECK: Normal pharynx, larynx and major salivary glands. No cervical lymphadenopathy. Unremarkable thyroid gland. UPPER CHEST: No pneumothorax or pleural effusion. No nodules or masses. AORTIC ARCH: There is mild calcific atherosclerosis  of the aortic arch. There is no aneurysm, dissection or hemodynamically significant stenosis of the visualized ascending aorta and aortic arch. Conventional 3 vessel aortic branching pattern. The visualized proximal subclavian arteries are widely patent. RIGHT CAROTID SYSTEM: --Common carotid artery: Widely patent origin without common carotid artery dissection or aneurysm. --Internal carotid artery: No dissection, occlusion or aneurysm. No hemodynamically significant stenosis. --External carotid artery: No acute abnormality. LEFT CAROTID SYSTEM: --Common carotid artery: Widely patent origin without common carotid artery dissection or aneurysm. --Internal carotid artery:There is noncalcified plaque at the left carotid bifurcation  extending into the proximal left ICA. No associated stenosis. --External carotid artery: No acute abnormality. VERTEBRAL ARTERIES: Left dominant configuration. Both origins are normal. No dissection, occlusion or flow-limiting stenosis to the vertebrobasilar confluence. CTA HEAD FINDINGS ANTERIOR CIRCULATION: --Intracranial internal carotid arteries: Normal. --Anterior cerebral arteries: Normal. Both A1 segments are present. Patent anterior communicating artery. --Middle cerebral arteries: Normal. --Posterior communicating arteries: Absent bilaterally. POSTERIOR CIRCULATION: --Basilar artery: Normal. --Posterior cerebral arteries: Normal. --Superior cerebellar arteries: Normal. --Inferior cerebellar arteries: Normal anterior and posterior inferior cerebellar arteries. VENOUS SINUSES: As permitted by contrast timing, patent. ANATOMIC VARIANTS: None DELAYED PHASE: No parenchymal contrast enhancement. Review of the MIP images confirms the above findings. IMPRESSION: 1. Old right frontal lobe infarct without acute intracranial abnormality. 2. No emergent large vessel occlusion or hemodynamically significant stenosis. 3. Noncalcified plaque in the left carotid bifurcation and proximal left internal carotid artery without hemodynamically significant stenosis by NASCET criteria. 4. Mild aortic atherosclerosis (ICD10-I70.0). Electronically Signed   By: Deatra Robinson M.D.   On: 04/09/2018 13:47   Ct Angio Neck W And/or Wo Contrast  Result Date: 04/09/2018 CLINICAL DATA:  Headaches and numbness of the left face and hand. Recent MRI showing left hemisphere infarct. EXAM: CT ANGIOGRAPHY HEAD AND NECK TECHNIQUE: Multidetector CT imaging of the head and neck was performed using the standard protocol during bolus administration of intravenous contrast. Multiplanar CT image reconstructions and MIPs were obtained to evaluate the vascular anatomy. Carotid stenosis measurements (when applicable) are obtained utilizing NASCET  criteria, using the distal internal carotid diameter as the denominator. CONTRAST:  50mL ISOVUE-370 IOPAMIDOL (ISOVUE-370) INJECTION 76% COMPARISON:  01/26/2010 CTA head neck FINDINGS: CT HEAD FINDINGS Brain: There is no mass, hemorrhage or extra-axial collection. The size and configuration of the ventricles and extra-axial CSF spaces are normal. There is an old right frontal lobe infarct. No evidence of acute infarct. The brain parenchyma is normal. Skull: The visualized skull base, calvarium and extracranial soft tissues are normal. Sinuses/Orbits: Partial opacification of the ethmoid air cells and left maxillary sinus. The orbits are normal. CTA NECK FINDINGS SKELETON: There is no bony spinal canal stenosis. No lytic or blastic lesion. OTHER NECK: Normal pharynx, larynx and major salivary glands. No cervical lymphadenopathy. Unremarkable thyroid gland. UPPER CHEST: No pneumothorax or pleural effusion. No nodules or masses. AORTIC ARCH: There is mild calcific atherosclerosis of the aortic arch. There is no aneurysm, dissection or hemodynamically significant stenosis of the visualized ascending aorta and aortic arch. Conventional 3 vessel aortic branching pattern. The visualized proximal subclavian arteries are widely patent. RIGHT CAROTID SYSTEM: --Common carotid artery: Widely patent origin without common carotid artery dissection or aneurysm. --Internal carotid artery: No dissection, occlusion or aneurysm. No hemodynamically significant stenosis. --External carotid artery: No acute abnormality. LEFT CAROTID SYSTEM: --Common carotid artery: Widely patent origin without common carotid artery dissection or aneurysm. --Internal carotid artery:There is noncalcified plaque at the left carotid bifurcation extending into the proximal left  ICA. No associated stenosis. --External carotid artery: No acute abnormality. VERTEBRAL ARTERIES: Left dominant configuration. Both origins are normal. No dissection, occlusion or  flow-limiting stenosis to the vertebrobasilar confluence. CTA HEAD FINDINGS ANTERIOR CIRCULATION: --Intracranial internal carotid arteries: Normal. --Anterior cerebral arteries: Normal. Both A1 segments are present. Patent anterior communicating artery. --Middle cerebral arteries: Normal. --Posterior communicating arteries: Absent bilaterally. POSTERIOR CIRCULATION: --Basilar artery: Normal. --Posterior cerebral arteries: Normal. --Superior cerebellar arteries: Normal. --Inferior cerebellar arteries: Normal anterior and posterior inferior cerebellar arteries. VENOUS SINUSES: As permitted by contrast timing, patent. ANATOMIC VARIANTS: None DELAYED PHASE: No parenchymal contrast enhancement. Review of the MIP images confirms the above findings. IMPRESSION: 1. Old right frontal lobe infarct without acute intracranial abnormality. 2. No emergent large vessel occlusion or hemodynamically significant stenosis. 3. Noncalcified plaque in the left carotid bifurcation and proximal left internal carotid artery without hemodynamically significant stenosis by NASCET criteria. 4. Mild aortic atherosclerosis (ICD10-I70.0). Electronically Signed   By: Deatra Robinson M.D.   On: 04/09/2018 13:47   Mr Brain Wo Contrast  Addendum Date: 04/09/2018   ADDENDUM REPORT: 04/09/2018 10:01 ADDENDUM: Study discussed by telephone with Dr. Kateri Plummer, on call for Dr. Blair Heys on 04/09/2018 at 0952 hours. We agreed that the best course of action may be to have the patient seen in the nearby [facility] Emergency Department today to facilitate evaluation by Neurology. Electronically Signed   By: Odessa Fleming M.D.   On: 04/09/2018 10:01   Result Date: 04/09/2018 CLINICAL DATA:  62 year old female with left side headaches and sporadic numbness in the right hand for 4-6 weeks. Prior stroke in 2011. EXAM: MRI HEAD WITHOUT CONTRAST TECHNIQUE: Multiplanar, multiecho pulse sequences of the brain and surrounding structures were obtained without  intravenous contrast. COMPARISON:  Brain MRI 09/01/2010. FINDINGS: Brain: Chronic right ACA territory ischemia and encephalomalacia with extension since the 2012 MRI, but no evidence of acute right hemisphere ischemia. There is associated chronic hemosiderin and mild ex vacuo enlargement of the right lateral ventricle. Patchy restricted diffusion in the left lateral occiput (series 3, image 72) corresponding to the left MCA/PCA watershed. Additionally, there are scattered small patchy areas of left frontal and parietal cortical and corona radiata abnormality on trace diffusion (series 3, image 87) which appears isointense on ADC. T2 and FLAIR hyperintensity in these areas compatible with cytotoxic edema and/or developing encephalomalacia. No hemorrhage or mass effect. No other diffusion abnormality. Signal remains normal in the bilateral deep gray matter nuclei, brainstem, and cerebellum. No other chronic cerebral blood products identified. No midline shift, mass effect, evidence of mass lesion, ventriculomegaly, extra-axial collection or acute intracranial hemorrhage. Cervicomedullary junction and pituitary are within normal limits. Vascular: Major intracranial vascular flow voids are stable since 2012. Skull and upper cervical spine: Negative visible cervical spine. Visualized bone marrow signal is within normal limits. Sinuses/Orbits: Negative orbit soft tissues. Moderate chronic paranasal sinus mucosal thickening has not significantly changed since 2012. Other: Visible internal auditory structures appear normal. Mastoids remain clear. Scalp and face soft tissues appear negative. IMPRESSION: 1. Small areas of mixed acute and subacute ischemia in the left hemisphere. These are primarily affecting the watershed areas of the Left MCA/ACA and Left MCA/PCA. The most acute area is in the left lateral occiput. No associated hemorrhage or mass effect. 2. Chronic but progressed right ACA territory ischemia since the 2012  MRI. Associated chronic hemosiderin, encephalomalacia, and ex vacuo enlargement of the right lateral ventricle. Electronically Signed: By: Odessa Fleming M.D. On: 04/09/2018 09:50  Procedures Procedures (including critical care time)  Medications Ordered in ED Medications  acetaminophen (TYLENOL) tablet 325 mg (has no administration in time range)  beta carotene w/minerals (OCUVITE) tablet 1 tablet (has no administration in time range)  multivitamin capsule 1 capsule (has no administration in time range)   stroke: mapping our early stages of recovery book (has no administration in time range)  acetaminophen (TYLENOL) tablet 650 mg (has no administration in time range)    Or  acetaminophen (TYLENOL) solution 650 mg (has no administration in time range)    Or  acetaminophen (TYLENOL) suppository 650 mg (has no administration in time range)  enoxaparin (LOVENOX) injection 40 mg (has no administration in time range)  aspirin EC tablet 81 mg (has no administration in time range)  iopamidol (ISOVUE-370) 76 % injection (50 mLs  Contrast Given 04/09/18 1311)     Initial Impression / Assessment and Plan / ED Course  I have reviewed the triage vital signs and the nursing notes.  Pertinent labs & imaging results that were available during my care of the patient were reviewed by me and considered in my medical decision making (see chart for details).     Patient presenting for further evaluation of CVA found on outpatient MRI.  Patient was evaluated by Dr. Laurence Slate with neurology who advised CT Angie of head and neck and admission to the hospitalist service for stroke work-up.  Labs are unremarkable.  Patient's neuro exam is without focal deficits on my exam.  I spoke with Dr. Elvera Lennox with [facility] who accepts patient for admission.  Patient also evaluated by my attending, Dr. Ethelda Chick, who guided the patient's management and agrees with plan.  Final Clinical Impressions(s) / ED Diagnoses   Final  diagnoses:  Acute CVA (cerebrovascular accident) [facility])    ED Discharge Orders    None       Emi Holes, PA-C 04/09/18 1510    Doug Sou, MD 04/09/18 1646

## 2018-04-09 NOTE — Consult Note (Addendum)
Neurology Consultation  Reason for Consult: Stroke  Referring Physician: Dr. Elvera Lennox  CC: HA, right hand paraesthesia both for the past couple of weeks    History is obtained from:Charlt review, and patient  HPI: Jamie Lambert is a 62 y.o. 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 She presents to the ER after completing an outpatient MRI that revealed  mixed acute and subacute ischemia in the left hemisphere/watershed areas of the left MCA/ACA. Apparently she'd been experiencing HA and right hand paraesthesias for the past couple of weeks. Prompting her to get evaluated for further workup. Subsequent her MRI finding she was called to come in for admission to have a  stroke workup completed.   She states that she has never been hypertensive up to this point, and in fact many times she is actually hypotensive. She does not check her pressures at home.  We have discussed stroke etiology and her pending work-up.  After talking about potential for cardioembolic source being because of stroke, and explanation of atrial fibrillation she admitted that most recently in the last month she has been feeling occasional palpitations which she describes as" fluttering."  She states that as she thinks that this was a very similar fluttering feeling that she experienced prior to her last stroke.  She is currently asymptomatic without any focal or neurological deficits. She denies headache, shortness of breath, chest pain, visual disturbance, sensory decline, impaired gait.  She does admit to a waxing and waning of right lateral hand and distal fingertips with paresthesia.   ROS: A 14 point ROS was performed and is negative except as noted in the HPI.   Past Medical History:  Diagnosis Date  . Allergy   . History of colonoscopy    adenomatous colon polyp (colonoscopy 11/2007)  . Hypercholesteremia   . Osteopenia   . Vascular spasm (HCC)    CVA  01/2010 due to spams  . Vitamin D deficiency    1-No significant post stroke disability and can perform usual duties with stroke symptoms  Stroke comorbidities include hypertension, hyperlipidemia, previous stroke  No family history on file.   Social History:   reports that she has never smoked. She has never used smokeless tobacco. Her alcohol and drug histories are not on file.  Medications  Current Facility-Administered Medications:  .  acetaminophen (TYLENOL) tablet 650 mg, 650 mg, Oral, Q4H PRN **OR** acetaminophen (TYLENOL) solution 650 mg, 650 mg, Per Tube, Q4H PRN **OR** acetaminophen (TYLENOL) suppository 650 mg, 650 mg, Rectal, Q4H PRN, Elvera Lennox, Costin M, MD .  acetaminophen (TYLENOL) tablet 325 mg, 325 mg, Oral, Q6H PRN, Leatha Gilding, MD .  Melene Muller ON 04/10/2018] aspirin EC tablet 81 mg, 81 mg, Oral, Daily, Gherghe, Costin M, MD .  enoxaparin (LOVENOX) injection 40 mg, 40 mg, Subcutaneous, QHS, Gherghe, Costin M, MD .  multivitamin (PROSIGHT) tablet 1 tablet, 1 tablet, Oral, Daily, Gherghe, Costin M, MD .  multivitamin with minerals tablet 1 tablet, 1 tablet, Oral, Daily, Gherghe, Daylene Katayama, MD  Current Outpatient Medications:  .  acetaminophen (TYLENOL) 325 MG tablet, Take 325 mg by mouth every 6 (six) hours as needed for mild pain. , Disp: , Rfl:  .  beta carotene w/minerals (OCUVITE) tablet, Take 1 tablet by mouth daily., Disp: , Rfl:  .  diazepam (VALIUM) 5 MG tablet, Take 5-10 mg by mouth See admin instructions. Take 1 to 2  tablets by mouth 30-60 minutes before MRI, Disp: , Rfl:  .  Multiple Vitamin (MULTIVITAMIN) capsule, Take 1 capsule by mouth daily., Disp: , Rfl:   Exam: Current vital signs: BP (!) 157/75   Pulse (!) 51   Temp 97.8 F (36.6 C) (Oral)   Resp 16   Ht 5\' 7"  (1.702 m)   Wt 63.5 kg   SpO2 99%   BMI 21.93 kg/m  Vital signs in last 24 hours: Temp:  [97.8 F (36.6 C)] 97.8 F (36.6 C) (10/27 1025) Pulse Rate:  [48-56] 51 (10/27 1600) Resp:   [14-16] 16 (10/27 1600) BP: (131-170)/(60-93) 157/75 (10/27 1600) SpO2:  [98 %-100 %] 99 % (10/27 1600) Weight:  [63.5 kg] 63.5 kg (10/27 1039)  GENERAL: Awake, alert in NAD HEENT: - Normocephalic and atraumatic, dry mm, no LN++, no Thyromegally LUNGS - Clear to auscultation bilaterally with no wheezes CV - S1S2 RRR, no m/r/g, equal pulses bilaterally. ABDOMEN - Soft, nontender, nondistended with normoactive BS Ext: warm, well perfused, intact peripheral pulses, no edema NEURO:  Mental Status: AA&Ox3  Language: speech is without aphasia or dysarthria.  Naming, repetition, fluency, and comprehension intact. Cranial Nerves: PERRL 3 mm/brisk. EOMI, visual fields full, no facial asymmetry, facial sensation intact, hearing intact, tongue/uvula/soft palate midline, normal  sternocleidomastoid and trapezius muscle strength. No evidence of tongue atrophy or fibrillations Motor: 5/5 RUE, RLE, LUE, LLE Tone: is normal and bulk is normal Sensation- Intact to light touch bilaterally Coordination: FTN intact bilaterally, no ataxia in BLE. Gait- deferred  NIHSS-0 Labs I have reviewed labs in epic and the results pertinent to this consultation are: See below, unremarkable, stroke labs pending  CBC    Component Value Date/Time   WBC 4.4 04/09/2018 1045   RBC 4.38 04/09/2018 1045   HGB 12.9 04/09/2018 1045   HCT 40.7 04/09/2018 1045   PLT 180 04/09/2018 1045   MCV 92.9 04/09/2018 1045   MCH 29.5 04/09/2018 1045   MCHC 31.7 04/09/2018 1045   RDW 11.6 04/09/2018 1045   LYMPHSABS 2.1 04/09/2018 1045   MONOABS 0.5 04/09/2018 1045   EOSABS 0.3 04/09/2018 1045   BASOSABS 0.1 04/09/2018 1045    CMP     Component Value Date/Time   NA 140 04/09/2018 1045   K 4.4 04/09/2018 1045   CL 108 04/09/2018 1045   CO2 27 04/09/2018 1045   GLUCOSE 106 (H) 04/09/2018 1045   BUN 12 04/09/2018 1045   CREATININE 0.77 04/09/2018 1045   CALCIUM 9.4 04/09/2018 1045   PROT 6.5 04/09/2018 1045   ALBUMIN  3.7 04/09/2018 1045   AST 23 04/09/2018 1045   ALT 20 04/09/2018 1045   ALKPHOS 63 04/09/2018 1045   BILITOT 0.7 04/09/2018 1045   GFRNONAA >60 04/09/2018 1045   GFRAA >60 04/09/2018 1045    Lipid Panel     Component Value Date/Time   CHOL  01/25/2010 0415    162        ATP III CLASSIFICATION:  <200     mg/dL   Desirable  332-951  mg/dL   Borderline High  >=884    mg/dL   High          TRIG 82 01/25/2010 0415   HDL 54 01/25/2010 0415   CHOLHDL 3.0 01/25/2010 0415   VLDL 16 01/25/2010 0415   LDLCALC  01/25/2010 0415    92        Total Cholesterol/HDL:CHD Risk Coronary Heart Disease Risk Table  Men   Women  1/2 Average Risk   3.4   3.3  Average Risk       5.0   4.4  2 X Average Risk   9.6   7.1  3 X Average Risk  23.4   11.0        Use the calculated Patient Ratio above and the CHD Risk Table to determine the patient's CHD Risk.        ATP III CLASSIFICATION (LDL):  <100     mg/dL   Optimal  161-096  mg/dL   Near or Above                    Optimal  130-159  mg/dL   Borderline  045-409  mg/dL   High  >811     mg/dL   Very High     Imaging I have reviewed the images obtained:  CT angio head &neck  1. Old right frontal lobe infarct without acute intracranial abnormality. 2. No emergent large vessel occlusion or hemodynamically significant stenosis. 3. Noncalcified plaque in the left carotid bifurcation and proximal left internal carotid artery without hemodynamically significant stenosis by NASCET criteria. 4. Mild aortic atherosclerosis (ICD10-I70.0).   MRI examination of the brain w/o contrast  1. Small areas of mixed acute and subacute ischemia in the left hemisphere. These are primarily affecting the watershed areas of the Left MCA/ACA and Left MCA/PCA. The most acute area is in the left lateral occiput. No associated hemorrhage or mass effect. 2. Chronic but progressed right ACA territory ischemia since the 2012 MRI. Associated  chronic hemosiderin, encephalomalacia, and ex vacuo enlargement of the right lateral ventricle.  Assessment:  Stroke both acute and sub-acute Left MCA/ACA and Left MCA/PCA. With watershed appearance. ESUS (embolic stroke of undetermined source) Suspicious for carotid disease with watershed infarct, but possible cardiac embolic component with multiple stroke in multiple territories.   Impression: Jamie Lambert is a 62 y.o. 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 She presents to the ER after completing an outpatient MRI that revealed  mixed acute and subacute ischemia in the left hemisphere/watershed areas of the left MCA/ACA. She has no history of afib.  Recommendations: #MRA Head- CTA head with poor quality- would assist in better visulaozation of vessels  For potential determination of embolic etiology  # Also U/S carotid dopplers- to determine % of carotid disease, stenosis or arthrosclerosis  #Transthoracic Echo, may need TEE and loop monitor to r/o atrial fib being a source of stroke with history of old stroke in multiple territories and new watershed appearance.  # Start patient on ASA 325mg  daily, Plavix 75 mg daily x 3 weeks if there are no contraindications  #Lipid panel in the am Start or continue Atorvastatin 80 mg/other high intensity statin- LDL-C goal <70 # BP goal: permissive HTN upto 220/120 mmHg- educated her about blood pressure monitoring at home and creating a trending log for providers  # HBAIC pending in am  # Telemetry monitoring # Frequent neuro checks # NPO until passes stroke swallow screen # please page stroke NP  Or  PA  Or MD from 8am -4 pm  as this patient from this time will be  followed by the stroke.   You can look them up on [email or web address]  Password [email or web address]   NEUROHOSPITALIST ADDENDUM Performed a face to face diagnostic evaluation.  I have reviewed the contents of history and physical exam  as documented by PA/ARNP/Resident and agree with above documentation.  I have discussed and formulated the above plan as documented. Edits to the note have been made as needed.  Patient presents with headache and intermittent numbness of her right hand.  No other focal deficits.  MRI shows multiple embolic infarcts in the left hemisphere, somewhat to be watershed pattern.  CT shows no obvious stenosis, will get MRA head as well as carotid Dopplers to confirm this.  Also as this patient has a cryptogenic stroke in the right ACA territory May need cardiac work-up including TEE and loop monitor.  Will start dual antiplatelets for now.    Georgiana Spinner Aroor MD [facility] [phone]   If 7pm to 7am, please call on call as listed on AMION.

## 2018-04-09 NOTE — ED Provider Notes (Signed)
Patient with intermittent headaches, frontal for the past 2 to 3 weeks with intermittent numb sensation of her right hand, not left hand.  No focal weakness.  No difficulty speaking.  No other associated symptoms.  She is presently asymptomatic except for mild headache.  She had an outpatie MRI scan of her brain this morning showing acute and subacute stroke.  Sent here for further evaluation on exam alert no distress Glasgow Coma Score 15 HEENT exam no facial asymmetry neck supple no bruit heart regular rate and rhythm no murmurs neurologic Glasgow Coma Score 15 cranial nerves II through XII grossly intact motor strength 5/5 overall DTR symmetric bilaterally at knee jerk ankle jerk and biceps was downgoing bilaterally   Doug Sou, MD 04/09/18 1147

## 2018-04-09 NOTE — H&P (Signed)
History and Physical    Jamie Lambert ZOX:[id number] DOB: [birth year] DOA: 04/09/2018  I have briefly reviewed the patient's prior medical records in [facility]  PCP: Blair Heys, MD  Patient coming from: home  Chief Complaint: Right finger numbness, intermittent  HPI: Jamie Lambert is a 62 y.o. female with medical history significant of hyperlipidemia, prior CVA in 2011 with no residual weakness, who presents to the hospital with chief complaint of intermittent headaches for the past several weeks as well as intermittent right hand numbness for the past couple weeks.  Patient has a history of CVA back in 2011 which resulted in some residual left-sided weakness which is currently completely resolved.  She underwent an MRI this morning as an outpatient this PCP was concerned, it showed mixed acute and subacute ischemia in the left hemisphere/watershed areas of the left MCA/ACA, she was called to, present to the emergency room.  In the ED her vital signs are stable, blood work is unremarkable.  Neurology was consulted and we are asked to admit.  Currently she denies any chest pain, denies any palpitations, she denies any shortness of breath.  No abdominal pain, nausea vomiting or diarrhea.  No recent fever or chills and other than the above-mentioned symptoms she is in her normal state of health.  Review of Systems: As per HPI otherwise 10 point review of systems negative.   Past Medical History:  Diagnosis Date  . Allergy   . History of colonoscopy    adenomatous colon polyp (colonoscopy 11/2007)  . Hypercholesteremia   . Osteopenia   . 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 Vitamin D deficiency     History reviewed. No pertinent surgical history.   reports that she has never smoked. She has never used smokeless tobacco. Her alcohol and drug histories are not on file.  Allergies  Allergen Reactions  . Prednisone Hives    No family history on  file.  Prior to Admission medications   Medication Sig Start Date End Date Taking? Authorizing Provider  acetaminophen (TYLENOL) 325 MG tablet Take 325 mg by mouth every 6 (six) hours as needed for mild pain.    Yes [provider]  beta carotene w/minerals (OCUVITE) tablet Take 1 tablet by mouth daily.   Yes [provider]  diazepam (VALIUM) 5 MG tablet Take 5-10 mg by mouth See admin instructions. Take 1 to 2 tablets by mouth 30-60 minutes before MRI   Yes [provider]  Multiple Vitamin (MULTIVITAMIN) capsule Take 1 capsule by mouth daily.   Yes [provider]    Physical Exam: Vitals:   04/09/18 1025 04/09/18 1039 04/09/18 1151 04/09/18 1200  BP: (!) 167/80  (S) (!) 155/61 (!) 131/93  Pulse: (!) 55  (!) 56 (!) 53  Resp: 15  14 16   Temp: 97.8 F (36.6 C)     TempSrc: Oral     SpO2: 99%  99% 99%  Weight:  63.5 kg    Height:  5\' 7"  (1.702 m)        Constitutional: NAD, calm, comfortable Eyes: PERRL, lids and conjunctivae normal ENMT: Mucous membranes are moist. Posterior pharynx clear of any exudate or lesions. Neck: normal, supple, no masses, no thyromegaly Respiratory: clear to auscultation bilaterally, no wheezing, no crackles. Normal respiratory effort. No accessory muscle use.  Cardiovascular: Regular rate and rhythm, no murmurs / rubs / gallops. No extremity edema. 2+ pedal pulses.  Abdomen: no  tenderness, no masses palpated. Bowel sounds positive.  Musculoskeletal: no clubbing / cyanosis. Normal muscle tone.  Skin: no rashes, lesions, ulcers. No induration Neurologic: CN 2-12 grossly intact. Strength 5/5 in all 4.  Psychiatric: Normal judgment and insight. Alert and oriented x 3. Normal mood.   Labs on Admission: I have personally reviewed following labs and imaging studies  CBC: Recent Labs  Lab 04/09/18 1045  WBC 4.4  NEUTROABS 1.5*  HGB 12.9  HCT 40.7  MCV 92.9  PLT 180   Basic Metabolic Panel: Recent Labs  Lab  04/09/18 1045  NA 140  K 4.4  CL 108  CO2 27  GLUCOSE 106*  BUN 12  CREATININE 0.77  CALCIUM 9.4   GFR: Estimated Creatinine Clearance: 71.8 mL/min (by C-G formula based on SCr of 0.77 mg/dL). Liver Function Tests: Recent Labs  Lab 04/09/18 1045  AST 23  ALT 20  ALKPHOS 63  BILITOT 0.7  PROT 6.5  ALBUMIN 3.7   No results for input(s): LIPASE, AMYLASE in the last 168 hours. No results for input(s): AMMONIA in the last 168 hours. Coagulation Profile: Recent Labs  Lab 04/09/18 1045  INR 0.99   Cardiac Enzymes: No results for input(s): CKTOTAL, CKMB, CKMBINDEX, TROPONINI in the last 168 hours. BNP (last 3 results) No results for input(s): PROBNP in the last 8760 hours. HbA1C: No results for input(s): HGBA1C in the last 72 hours. CBG: No results for input(s): GLUCAP in the last 168 hours. Lipid Profile: No results for input(s): CHOL, HDL, LDLCALC, TRIG, CHOLHDL, LDLDIRECT in the last 72 hours. Thyroid Function Tests: No results for input(s): TSH, T4TOTAL, FREET4, T3FREE, THYROIDAB in the last 72 hours. Anemia Panel: No results for input(s): VITAMINB12, FOLATE, FERRITIN, TIBC, IRON, RETICCTPCT in the last 72 hours. Urine analysis:    Component Value Date/Time   COLORURINE YELLOW 01/24/2010 1145   APPEARANCEUR CLEAR 01/24/2010 1145   LABSPEC 1.009 01/24/2010 1145   PHURINE 7.0 01/24/2010 1145   GLUCOSEU NEGATIVE 01/24/2010 1145   HGBUR NEGATIVE 01/24/2010 1145   BILIRUBINUR NEGATIVE 01/24/2010 1145   KETONESUR NEGATIVE 01/24/2010 1145   PROTEINUR NEGATIVE 01/24/2010 1145   UROBILINOGEN 0.2 01/24/2010 1145   NITRITE NEGATIVE 01/24/2010 1145   LEUKOCYTESUR  01/24/2010 1145    NEGATIVE MICROSCOPIC NOT DONE ON URINES WITH NEGATIVE PROTEIN, BLOOD, LEUKOCYTES, NITRITE, OR GLUCOSE <1000 mg/dL.     Radiological Exams on Admission: Mr Brain Wo Contrast  Addendum Date: 04/09/2018   ADDENDUM REPORT: 04/09/2018 10:01 ADDENDUM: Study discussed by telephone with Dr.  Kateri Plummer, on call for Dr. Blair Heys on 04/09/2018 at 0952 hours. We agreed that the best course of action may be to have the patient seen in the nearby [facility] Emergency Department today to facilitate evaluation by Neurology. Electronically Signed   By: Odessa Fleming M.D.   On: 04/09/2018 10:01   Result Date: 04/09/2018 CLINICAL DATA:  62 year old female with left side headaches and sporadic numbness in the right hand for 4-6 weeks. Prior stroke in 2011. EXAM: MRI HEAD WITHOUT CONTRAST TECHNIQUE: Multiplanar, multiecho pulse sequences of the brain and surrounding structures were obtained without intravenous contrast. COMPARISON:  Brain MRI 09/01/2010. FINDINGS: Brain: Chronic right ACA territory ischemia and encephalomalacia with extension since the 2012 MRI, but no evidence of acute right hemisphere ischemia. There is associated chronic hemosiderin and mild ex vacuo enlargement of the right lateral ventricle. Patchy restricted diffusion in the left lateral occiput (series 3, image 72) corresponding to the left MCA/PCA watershed. Additionally, there  are scattered small patchy areas of left frontal and parietal cortical and corona radiata abnormality on trace diffusion (series 3, image 87) which appears isointense on ADC. T2 and FLAIR hyperintensity in these areas compatible with cytotoxic edema and/or developing encephalomalacia. No hemorrhage or mass effect. No other diffusion abnormality. Signal remains normal in the bilateral deep gray matter nuclei, brainstem, and cerebellum. No other chronic cerebral blood products identified. No midline shift, mass effect, evidence of mass lesion, ventriculomegaly, extra-axial collection or acute intracranial hemorrhage. Cervicomedullary junction and pituitary are within normal limits. Vascular: Major intracranial vascular flow voids are stable since 2012. Skull and upper cervical spine: Negative visible cervical spine. Visualized bone marrow signal is within normal limits.  Sinuses/Orbits: Negative orbit soft tissues. Moderate chronic paranasal sinus mucosal thickening has not significantly changed since 2012. Other: Visible internal auditory structures appear normal. Mastoids remain clear. Scalp and face soft tissues appear negative. IMPRESSION: 1. Small areas of mixed acute and subacute ischemia in the left hemisphere. These are primarily affecting the watershed areas of the Left MCA/ACA and Left MCA/PCA. The most acute area is in the left lateral occiput. No associated hemorrhage or mass effect. 2. Chronic but progressed right ACA territory ischemia since the 2012 MRI. Associated chronic hemosiderin, encephalomalacia, and ex vacuo enlargement of the right lateral ventricle. Electronically Signed: By: Odessa Fleming M.D. On: 04/09/2018 09:50    EKG: Independently reviewed.  Sinus rhythm  Assessment/Plan Active Problems:   CVA (cerebral vascular accident) (HCC)   Acute CVA -MRI positive for CVA, neurology consulted, admit to telemetry, obtain CT angiogram of the head and neck, 2D echo, lipid panel, A1c -PT/OT -Start aspirin -Currently no neuro deficits  Essential hypertension -She is not on any medications at home but she is hypertensive in the ED, for now allow permissive hypertension, if this continues may benefit from antihypertensives in the future    DVT prophylaxis: Lovenox  Code Status: Full code  Family Communication: husband at bedside Disposition Plan: home when ready  Consults called: neurology     Pamella Pert, MD [facility] Pager [phone]  If 7PM-7AM, please contact night-coverage [email or web address] Password [facility]  04/09/2018, 12:26 PM

## 2018-04-10 ENCOUNTER — Encounter (HOSPITAL_COMMUNITY): Payer: Self-pay | Admitting: Physician Assistant

## 2018-04-10 ENCOUNTER — Inpatient Hospital Stay (HOSPITAL_COMMUNITY): Payer: BC Managed Care – PPO

## 2018-04-10 ENCOUNTER — Encounter (HOSPITAL_COMMUNITY)
Admission: EM | Disposition: A | Discharge status: Home / Self Care | Payer: Self-pay | Source: Home / Self Care | Attending: Internal Medicine

## 2018-04-10 DIAGNOSIS — I369 Nonrheumatic tricuspid valve disorder, unspecified: Secondary | ICD-10-CM

## 2018-04-10 DIAGNOSIS — I6300 Cerebral infarction due to thrombosis of unspecified precerebral artery: Secondary | ICD-10-CM

## 2018-04-10 DIAGNOSIS — I6389 Other cerebral infarction: Secondary | ICD-10-CM

## 2018-04-10 HISTORY — PX: LOOP RECORDER INSERTION: EP1214

## 2018-04-10 LAB — LIPID PANEL
Cholesterol: 241 mg/dL — ABNORMAL HIGH (ref 0–200)
HDL: 75 mg/dL (ref >40)
LDL Cholesterol: 155 mg/dL — ABNORMAL HIGH (ref 0–99)
Total CHOL/HDL Ratio: 3.2 RATIO
Triglycerides: 54 mg/dL (ref <150)
VLDL: 11 mg/dL (ref 0–40)

## 2018-04-10 LAB — HIV ANTIBODY (ROUTINE TESTING W REFLEX): HIV SCREEN 4TH GENERATION: NONREACTIVE

## 2018-04-10 LAB — HEMOGLOBIN A1C
HEMOGLOBIN A1C: 5 % (ref 4.8–5.6)
MEAN PLASMA GLUCOSE: 96.8 mg/dL

## 2018-04-10 LAB — ECHOCARDIOGRAM COMPLETE
HEIGHTINCHES: 67 in
WEIGHTICAEL: 2240 [oz_av]

## 2018-04-10 LAB — TROPONIN I: Troponin I: <0.03 ng/mL (ref <0.03)

## 2018-04-10 LAB — TSH: TSH: 0.854 u[IU]/mL (ref 0.350–4.500)

## 2018-04-10 SURGERY — LOOP RECORDER INSERTION

## 2018-04-10 MED ORDER — LIDOCAINE-EPINEPHRINE 1 %-1:100000 IJ SOLN
INTRAMUSCULAR | Status: AC
  Filled 2018-04-10: qty 1

## 2018-04-10 MED ORDER — NITROGLYCERIN 0.4 MG SL SUBL: 0.4000 mg | SUBLINGUAL_TABLET | SUBLINGUAL | Status: DC | PRN

## 2018-04-10 MED ORDER — LIDOCAINE-EPINEPHRINE 1 %-1:100000 IJ SOLN
INTRAMUSCULAR | Status: DC | PRN
  Administered 2018-04-10: 20 mL

## 2018-04-10 MED ORDER — CLOPIDOGREL BISULFATE 75 MG PO TABS
75.0000 mg | ORAL_TABLET | Freq: Every day | ORAL | Status: DC
  Filled 2018-04-10: qty 1

## 2018-04-10 MED ORDER — CLOPIDOGREL BISULFATE 75 MG PO TABS: 75.0000 mg | ORAL_TABLET | Freq: Every day | ORAL | 0 refills | Status: DC

## 2018-04-10 MED ORDER — ASPIRIN 81 MG PO TBEC: 81.0000 mg | DELAYED_RELEASE_TABLET | Freq: Every day | ORAL | 1 refills | Status: AC

## 2018-04-10 SURGICAL SUPPLY — 2 items
LOOP REVEAL LINQSYS (Prosthesis & Implant Heart) ×2 IMPLANT
PACK LOOP INSERTION (CUSTOM PROCEDURE TRAY) ×3 IMPLANT

## 2018-04-10 NOTE — Progress Notes (Addendum)
STROKE TEAM PROGRESS NOTE  HPI:( Dr Laurence Slate )  Jamie Lambert is a 62 y.o. 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 She presents to the ER after completing an outpatient MRI that revealed mixed acute and subacute ischemia in the left hemisphere/watershed areas of the left MCA/ACA. Apparently she'd been experiencing HA and right hand paraesthesias for the past couple of weeks. Prompting her to get evaluated for further workup. Subsequent her MRI finding she was called to come in for admission to have a  stroke workup completed.   She states that she has never been hypertensive up to this point, and in fact many times she is actually hypotensive. She does not check her pressures at home.  We have discussed stroke etiology and her pending work-up.  After talking about potential for cardioembolic source being because of stroke, and explanation of atrial fibrillation she admitted that most recently in the last month she has been feeling occasional palpitations which she describes as" fluttering."  She states that as she thinks that this was a very similar fluttering feeling that she experienced prior to her last stroke.  She is currently asymptomatic without any focal or neurological deficits. She denies headache, shortness of breath, chest pain, visual disturbance, sensory decline, impaired gait.  She does admit to a waxing and waning of right lateral hand and distal fingertips with paresthesia.  INTERVAL HISTORY Her husband is at the bedside.  She had sx for 2 weeks before coming to the hospital - sx mild, took over 1 week to get OP MRI  Vitals:   04/09/18 2338 04/10/18 0211 04/10/18 0407 04/10/18 0734  BP: 138/70 (!) 149/58 140/66 140/64  Pulse: (!) 49 60 (!) 48 (!) 55  Resp: 18 18 18 18   Temp: (!) 97.5 F (36.4 C) 97.8 F (36.6 C) 97.9 F (36.6 C) 98.2 F (36.8 C)  TempSrc: Oral Oral Oral Oral  SpO2: 97% 99% 98% 97%  Weight:       Height:        CBC:  Recent Labs  Lab 04/09/18 1045  WBC 4.4  NEUTROABS 1.5*  HGB 12.9  HCT 40.7  MCV 92.9  PLT 180    Basic Metabolic Panel:  Recent Labs  Lab 04/09/18 1045  NA 140  K 4.4  CL 108  CO2 27  GLUCOSE 106*  BUN 12  CREATININE 0.77  CALCIUM 9.4   Lipid Panel:     Component Value Date/Time   CHOL 241 (H) 04/10/2018 0226   TRIG 54 04/10/2018 0226   HDL 75 04/10/2018 0226   CHOLHDL 3.2 04/10/2018 0226   VLDL 11 04/10/2018 0226   LDLCALC 155 (H) 04/10/2018 0226   HgbA1c:  Lab Results  Component Value Date   HGBA1C 5.0 04/10/2018   Urine Drug Screen: No results found for: LABOPIA, COCAINSCRNUR, LABBENZ, AMPHETMU, THCU, LABBARB  Alcohol Level No results found for: ETH  IMAGING Ct Angio Head W Or Wo Contrast  Result Date: 04/09/2018 CLINICAL DATA:  Headaches and numbness of the left face and hand. Recent MRI showing left hemisphere infarct. EXAM: CT ANGIOGRAPHY HEAD AND NECK TECHNIQUE: Multidetector CT imaging of the head and neck was performed using the standard protocol during bolus administration of intravenous contrast. Multiplanar CT image reconstructions and MIPs were obtained to evaluate the vascular anatomy. Carotid stenosis measurements (when applicable) are obtained utilizing NASCET criteria, using the distal internal  carotid diameter as the denominator. CONTRAST:  50mL ISOVUE-370 IOPAMIDOL (ISOVUE-370) INJECTION 76% COMPARISON:  01/26/2010 CTA head neck FINDINGS: CT HEAD FINDINGS Brain: There is no mass, hemorrhage or extra-axial collection. The size and configuration of the ventricles and extra-axial CSF spaces are normal. There is an old right frontal lobe infarct. No evidence of acute infarct. The brain parenchyma is normal. Skull: The visualized skull base, calvarium and extracranial soft tissues are normal. Sinuses/Orbits: Partial opacification of the ethmoid air cells and left maxillary sinus. The orbits are normal. CTA NECK FINDINGS SKELETON:  There is no bony spinal canal stenosis. No lytic or blastic lesion. OTHER NECK: Normal pharynx, larynx and major salivary glands. No cervical lymphadenopathy. Unremarkable thyroid gland. UPPER CHEST: No pneumothorax or pleural effusion. No nodules or masses. AORTIC ARCH: There is mild calcific atherosclerosis of the aortic arch. There is no aneurysm, dissection or hemodynamically significant stenosis of the visualized ascending aorta and aortic arch. Conventional 3 vessel aortic branching pattern. The visualized proximal subclavian arteries are widely patent. RIGHT CAROTID SYSTEM: --Common carotid artery: Widely patent origin without common carotid artery dissection or aneurysm. --Internal carotid artery: No dissection, occlusion or aneurysm. No hemodynamically significant stenosis. --External carotid artery: No acute abnormality. LEFT CAROTID SYSTEM: --Common carotid artery: Widely patent origin without common carotid artery dissection or aneurysm. --Internal carotid artery:There is noncalcified plaque at the left carotid bifurcation extending into the proximal left ICA. No associated stenosis. --External carotid artery: No acute abnormality. VERTEBRAL ARTERIES: Left dominant configuration. Both origins are normal. No dissection, occlusion or flow-limiting stenosis to the vertebrobasilar confluence. CTA HEAD FINDINGS ANTERIOR CIRCULATION: --Intracranial internal carotid arteries: Normal. --Anterior cerebral arteries: Normal. Both A1 segments are present. Patent anterior communicating artery. --Middle cerebral arteries: Normal. --Posterior communicating arteries: Absent bilaterally. POSTERIOR CIRCULATION: --Basilar artery: Normal. --Posterior cerebral arteries: Normal. --Superior cerebellar arteries: Normal. --Inferior cerebellar arteries: Normal anterior and posterior inferior cerebellar arteries. VENOUS SINUSES: As permitted by contrast timing, patent. ANATOMIC VARIANTS: None DELAYED PHASE: No parenchymal contrast  enhancement. Review of the MIP images confirms the above findings. IMPRESSION: 1. Old right frontal lobe infarct without acute intracranial abnormality. 2. No emergent large vessel occlusion or hemodynamically significant stenosis. 3. Noncalcified plaque in the left carotid bifurcation and proximal left internal carotid artery without hemodynamically significant stenosis by NASCET criteria. 4. Mild aortic atherosclerosis (ICD10-I70.0). Electronically Signed   By: Deatra Robinson M.D.   On: 04/09/2018 13:47   Ct Angio Neck W And/or Wo Contrast  Result Date: 04/09/2018 CLINICAL DATA:  Headaches and numbness of the left face and hand. Recent MRI showing left hemisphere infarct. EXAM: CT ANGIOGRAPHY HEAD AND NECK TECHNIQUE: Multidetector CT imaging of the head and neck was performed using the standard protocol during bolus administration of intravenous contrast. Multiplanar CT image reconstructions and MIPs were obtained to evaluate the vascular anatomy. Carotid stenosis measurements (when applicable) are obtained utilizing NASCET criteria, using the distal internal carotid diameter as the denominator. CONTRAST:  50mL ISOVUE-370 IOPAMIDOL (ISOVUE-370) INJECTION 76% COMPARISON:  01/26/2010 CTA head neck FINDINGS: CT HEAD FINDINGS Brain: There is no mass, hemorrhage or extra-axial collection. The size and configuration of the ventricles and extra-axial CSF spaces are normal. There is an old right frontal lobe infarct. No evidence of acute infarct. The brain parenchyma is normal. Skull: The visualized skull base, calvarium and extracranial soft tissues are normal. Sinuses/Orbits: Partial opacification of the ethmoid air cells and left maxillary sinus. The orbits are normal. CTA NECK FINDINGS SKELETON: There is no bony spinal canal  stenosis. No lytic or blastic lesion. OTHER NECK: Normal pharynx, larynx and major salivary glands. No cervical lymphadenopathy. Unremarkable thyroid gland. UPPER CHEST: No pneumothorax or  pleural effusion. No nodules or masses. AORTIC ARCH: There is mild calcific atherosclerosis of the aortic arch. There is no aneurysm, dissection or hemodynamically significant stenosis of the visualized ascending aorta and aortic arch. Conventional 3 vessel aortic branching pattern. The visualized proximal subclavian arteries are widely patent. RIGHT CAROTID SYSTEM: --Common carotid artery: Widely patent origin without common carotid artery dissection or aneurysm. --Internal carotid artery: No dissection, occlusion or aneurysm. No hemodynamically significant stenosis. --External carotid artery: No acute abnormality. LEFT CAROTID SYSTEM: --Common carotid artery: Widely patent origin without common carotid artery dissection or aneurysm. --Internal carotid artery:There is noncalcified plaque at the left carotid bifurcation extending into the proximal left ICA. No associated stenosis. --External carotid artery: No acute abnormality. VERTEBRAL ARTERIES: Left dominant configuration. Both origins are normal. No dissection, occlusion or flow-limiting stenosis to the vertebrobasilar confluence. CTA HEAD FINDINGS ANTERIOR CIRCULATION: --Intracranial internal carotid arteries: Normal. --Anterior cerebral arteries: Normal. Both A1 segments are present. Patent anterior communicating artery. --Middle cerebral arteries: Normal. --Posterior communicating arteries: Absent bilaterally. POSTERIOR CIRCULATION: --Basilar artery: Normal. --Posterior cerebral arteries: Normal. --Superior cerebellar arteries: Normal. --Inferior cerebellar arteries: Normal anterior and posterior inferior cerebellar arteries. VENOUS SINUSES: As permitted by contrast timing, patent. ANATOMIC VARIANTS: None DELAYED PHASE: No parenchymal contrast enhancement. Review of the MIP images confirms the above findings. IMPRESSION: 1. Old right frontal lobe infarct without acute intracranial abnormality. 2. No emergent large vessel occlusion or hemodynamically  significant stenosis. 3. Noncalcified plaque in the left carotid bifurcation and proximal left internal carotid artery without hemodynamically significant stenosis by NASCET criteria. 4. Mild aortic atherosclerosis (ICD10-I70.0). Electronically Signed   By: Deatra Robinson M.D.   On: 04/09/2018 13:47   Mr Brain Wo Contrast  Addendum Date: 04/09/2018   ADDENDUM REPORT: 04/09/2018 10:01 ADDENDUM: Study discussed by telephone with Dr. Kateri Plummer, on call for Dr. Blair Heys on 04/09/2018 at 0952 hours. We agreed that the best course of action may be to have the patient seen in the nearby [facility] today to facilitate evaluation by Neurology. Electronically Signed   By: Odessa Fleming M.D.   On: 04/09/2018 10:01   Result Date: 04/09/2018 CLINICAL DATA:  62 year old female with left side headaches and sporadic numbness in the right hand for 4-6 weeks. Prior stroke in 2011. EXAM: MRI HEAD WITHOUT CONTRAST TECHNIQUE: Multiplanar, multiecho pulse sequences of the brain and surrounding structures were obtained without intravenous contrast. COMPARISON:  Brain MRI 09/01/2010. FINDINGS: Brain: Chronic right ACA territory ischemia and encephalomalacia with extension since the 2012 MRI, but no evidence of acute right hemisphere ischemia. There is associated chronic hemosiderin and mild ex vacuo enlargement of the right lateral ventricle. Patchy restricted diffusion in the left lateral occiput (series 3, image 72) corresponding to the left MCA/PCA watershed. Additionally, there are scattered small patchy areas of left frontal and parietal cortical and corona radiata abnormality on trace diffusion (series 3, image 87) which appears isointense on ADC. T2 and FLAIR hyperintensity in these areas compatible with cytotoxic edema and/or developing encephalomalacia. No hemorrhage or mass effect. No other diffusion abnormality. Signal remains normal in the bilateral deep gray matter nuclei, brainstem, and cerebellum. No  other chronic cerebral blood products identified. No midline shift, mass effect, evidence of mass lesion, ventriculomegaly, extra-axial collection or acute intracranial hemorrhage. Cervicomedullary junction and pituitary are within normal limits. Vascular: Major intracranial  vascular flow voids are stable since 2012. Skull and upper cervical spine: Negative visible cervical spine. Visualized bone marrow signal is within normal limits. Sinuses/Orbits: Negative orbit soft tissues. Moderate chronic paranasal sinus mucosal thickening has not significantly changed since 2012. Other: Visible internal auditory structures appear normal. Mastoids remain clear. Scalp and face soft tissues appear negative. IMPRESSION: 1. Small areas of mixed acute and subacute ischemia in the left hemisphere. These are primarily affecting the watershed areas of the Left MCA/ACA and Left MCA/PCA. The most acute area is in the left lateral occiput. No associated hemorrhage or mass effect. 2. Chronic but progressed right ACA territory ischemia since the 2012 MRI. Associated chronic hemosiderin, encephalomalacia, and ex vacuo enlargement of the right lateral ventricle. Electronically Signed: By: Odessa Fleming M.D. On: 04/09/2018 09:50   Dg Chest Port 1 View  Result Date: 04/10/2018 CLINICAL DATA:  62 year old female with chest pain. EXAM: PORTABLE CHEST 1 VIEW COMPARISON:  None. FINDINGS: The heart size and mediastinal contours are within normal limits. Both lungs are clear. The visualized skeletal structures are unremarkable. IMPRESSION: No active disease. Electronically Signed   By: Elgie Collard M.D.   On: 04/10/2018 02:52    PHYSICAL EXAM Pleasant frail middle-aged Caucasian lady currently not in distress. . Afebrile. Head is nontraumatic. Neck is supple without bruit.    Cardiac exam no murmur or gallop. Lungs are clear to auscultation. Distal pulses are well felt.  Neurological Exam ;  Awake  Alert oriented x 3. Normal speech and  language.eye movements full without nystagmus.fundi were not visualized. Vision acuity and fields appear normal. Hearing is normal. Palatal movements are normal. Face symmetric. Tongue midline. Normal strength, tone, reflexes and coordination. Normal sensation. Gait deferred. NIHSS 0  ASSESSMENT/PLAN Jamie Lambert is a 62 y.o. female with history of stroke in 2011, HTN and HLD presenting with R hand paresthesia and HA for several weeks. OP MRI positive for acute stroke. Ongoing intermittent hand tingling. .   Stroke:  left acute/subacute small embolic infarcts in setting of priorr R watershed infarct in 2011, both felt to be embolic secondary to unknown source.   MRI  sm mixed acute and subacute L brain infarcts, watershed area, L MCA/ACA, L MCA/PCA, largest L occiput. Chronic R ACA ischemia since 2012  CTA head & neck old R frontal infarct. No ELVO. Noncalcified plaque L ICA bifurcation and proximal L ICA. Mild aortic atherosclerosis 2D Echo  Left ventricle: The cavity size was normal. Wall thickness was   normal. Systolic function was normal. The estimated ejection   fraction was in the range of 60% to 65%. Left ventricular    diastolic function parameters were normal  TEE negative in 2011  a [facility] electrophysiologist will consult and consider placement of an implantable loop recorder to evaluate for atrial fibrillation as etiology of stroke. This has been explained to patient/family by Dr. Pearlean Brownie and they are agreeable.   LDL 155  HgbA1c 5.0  HIV pending   Lovenox 40 mg sq daily for VTE prophylaxis  No antithrombotic prior to admission (was on aspirin 81 mg daily for years, stopped in March at time of PCP followup because she was cold in the winter), now on aspirin 81 mg daily. Given mild stroke, recommend aspirin 81 mg and plavix 75 mg daily x 3 weeks, then aspirin alone. Orders adjusted.   Therapy recommendations:  No PT, no OT  Disposition:   Return home  Hypertension  120-170s past  24h  No home meds listed  On no antihypertensives at this time . Ok for BP goal normotensive at this time  Hyperlipidemia  Home meds:  No statin  LDL 155, goal < 70  Has tried multiple statins in the past with resultant myalgias, including crestor  Recommend OP eval for PCSK9. Will need to be evaluated by Dr. Manus Gunning or cardiology  Continue statin at discharge  Other Stroke Risk Factors  Hx stroke/TIA  01/2010 subacute R watershed infarct w/ ? Clot R ACA. Had TEE, negative  Intermittent HA, controlled with OTC meds  Hospital day # 1  Annie Main, MSN, APRN, ANVP-BC, AGPCNP-BC Advanced Practice Stroke Nurse [facility] See Amion for Schedule & Pager information 04/10/2018 10:53 AM  She presented with subacute symptoms of headache and right hand paresthesias secondary to small embolic but slightly for typical pseudo-watershed pattern looking infarcts. Interestingly she had similar infarcts in the right side in 2011 with negative workup for embolic including TEE and 30 day outpatient cardiac monitoring. Recommend loop recorder insertion look for paroxysmal A. Fib. Dual antiplatelet therapy aspirin and Plavix for 3 weeks followed by aspirin alone. Long discussion the patient and husband regarding the etiology of stroke and pancreatic evaluation and treatment and answered questions.I returned in the afternoon and spoke again to the patient and husband and addressed the concerns as the head raises a possibility of a second neurological opinion. After answered their questions about her stroke in the treatment plan day seemed satisfied and said they would not want a second opinion. Follow-up as an outpatient in stroke clinic in 6 weeks. Discussed with Dr. Ladona Ridgel and Dr. Blake Divine. Greater than 50% time during this 35 minute prolonged visit was spent on counseling and coordination of care but an embolic strokes discussion with other  physicians and Delia Heady, MD  [facility] [address] [place], [place] [address]  Phone [phone] Fax [phone] To contact Stroke Continuity provider, please refer to [email or web address]. After hours, contact General Neurology

## 2018-04-10 NOTE — Progress Notes (Signed)
SLP Cancellation Note  Patient Details Name: Jamie Lambert MRN: [id number] DOB: [birth year]   Cancelled treatment:       Reason Eval/Treat Not Completed: SLP screened, no needs identified, will sign off   Geraldine Tesar 04/10/2018, 12:12 PM

## 2018-04-10 NOTE — Progress Notes (Signed)
Pt C/O sudden crushing mid sternal chest pain, and new onset neck pain. A/Ox4. VS taken and [facility] provider notified, orders received. Jorge Ny

## 2018-04-10 NOTE — Progress Notes (Signed)
Pt. And husband (at bedside) state they would like some clarification on the plan of care regarding current treatment and prevention of future strokes. Pt. Would like to hold Plavix until concerns regarding plan of care are addressed. They are interested in seeking out a second opinion from neurology. Provider notified. Will continue to monitor patient and situation.  Enzo Bi, RN

## 2018-04-10 NOTE — Progress Notes (Signed)
  Echocardiogram 2D Echocardiogram has been performed.  Jamie Lambert 04/10/2018, 9:18 AM

## 2018-04-10 NOTE — Discharge Instructions (Signed)
Post implant site care instructions °Keep incision clean and dry for 3 days..  °You can remove outer dressing tomorrow. °Leave steri-strips (little pieces of tape) on until seen in the office for wound check appointment. °Call the office ([phone]) for redness, drainage, swelling, or fever. ° °

## 2018-04-10 NOTE — Consult Note (Addendum)
ELECTROPHYSIOLOGY CONSULT NOTE  Patient ID: Jamie Lambert MRN: [id number], DOB/AGE: 1956-05-20   Admit date: 04/09/2018 Date of Consult: 04/10/2018  Primary Physician: Blair Heys, MD Primary Cardiologist: none Reason for Consultation: Cryptogenic stroke ; recommendations regarding Implantable Loop Recorder, requested by Dr. Pearlean Brownie  History of Present Illness Jamie Lambert was admitted on 04/09/2018 with intermittent headaches and R hand numbness, out patient found with stroke and referred to [facility] ER.  PMHx noted for HLD and prior stroke.  Imaging demonstrated left acute/subacute watershed infarct.  she has undergone workup for stroke including echocardiogram and carotid angio.  The patient has been monitored on telemetry which has demonstrated sinus rhythm with no arrhythmias.  In discussion with Dr. Pearlean Brownie, given neg TEE with her 1st stroke in 201, not felt to need another this admission, asks to proceed with loop monitoring   Echocardiogram this admission demonstrated  Study Conclusions - Left ventricle: The cavity size was normal. Wall thickness was   normal. Systolic function was normal. The estimated ejection   fraction was in the range of 60% to 65%. Left ventricular   diastolic function parameters were normal.   Lab work is reviewed.  Prior to admission, the patient denies chest pain, shortness of breath, dizziness, or syncope.  She has had some palpitations, a brief fluttering perhaps a couple weeks ago, these are rare and brief. She is at her baseline from the stroke with plans to home at discharge.   Past Medical History:  Diagnosis Date  . Allergy   . History of colonoscopy    adenomatous colon polyp (colonoscopy 11/2007)  . Hypercholesteremia   . Osteopenia   . 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 Vitamin D deficiency      Surgical History: History reviewed. No pertinent surgical history.   Medications Prior to Admission  Medication Sig  Dispense Refill Last Dose  . acetaminophen (TYLENOL) 325 MG tablet Take 325 mg by mouth every 6 (six) hours as needed for mild pain.    Past Week at Unknown time  . beta carotene w/minerals (OCUVITE) tablet Take 1 tablet by mouth daily.   04/09/2018 at Unknown time  . diazepam (VALIUM) 5 MG tablet Take 5-10 mg by mouth See admin instructions. Take 1 to 2 tablets by mouth 30-60 minutes before MRI   04/09/2018 at Unknown time  . Multiple Vitamin (MULTIVITAMIN) capsule Take 1 capsule by mouth daily.   04/09/2018 at Unknown time    Inpatient Medications:  . aspirin EC  81 mg Oral Daily  . clopidogrel  75 mg Oral Daily  . enoxaparin (LOVENOX) injection  40 mg Subcutaneous QHS  . multivitamin  1 tablet Oral Daily  . multivitamin with minerals  1 tablet Oral Daily    Allergies:  Allergies  Allergen Reactions  . Prednisone Hives    Social History   Socioeconomic History  . Marital status: Married    Spouse name: Not on file  . Number of children: Not on file  . Years of education: Not on file  . Highest education level: Not on file  Occupational History  . Not on file  Social Needs  . Financial resource strain: Not on file  . Food insecurity:    Worry: Not on file    Inability: Not on file  . Transportation needs:    Medical: Not on file    Non-medical: Not on file  Tobacco Use  . Smoking status: Never  Smoker  . Smokeless tobacco: Never Used  Substance and Sexual Activity  . Alcohol use: Not on file  . Drug use: Not on file  . Sexual activity: Not on file  Lifestyle  . Physical activity:    Days per week: Not on file    Minutes per session: Not on file  . Stress: Not on file  Relationships  . Social connections:    Talks on phone: Not on file    Gets together: Not on file    Attends religious service: Not on file    Active member of club or organization: Not on file    Attends meetings of clubs or organizations: Not on file    Relationship status: Not on file  .  Intimate partner violence:    Fear of current or ex partner: Not on file    Emotionally abused: Not on file    Physically abused: Not on file    Forced sexual activity: Not on file  Other Topics Concern  . Not on file  Social History Narrative  . Not on file     Family History  Problem Relation Age of Onset  . Hypertension Mother   . Dementia Mother   . COPD Father   . Alcoholism Brother   . Stroke Maternal Grandmother       Review of Systems: All other systems reviewed and are otherwise negative except as noted above.  Physical Exam: Vitals:   04/09/18 2338 04/10/18 0211 04/10/18 0407 04/10/18 0734  BP: 138/70 (!) 149/58 140/66 140/64  Pulse: (!) 49 60 (!) 48 (!) 55  Resp: 18 18 18 18   Temp: (!) 97.5 F (36.4 C) 97.8 F (36.6 C) 97.9 F (36.6 C) 98.2 F (36.8 C)  TempSrc: Oral Oral Oral Oral  SpO2: 97% 99% 98% 97%  Weight:      Height:        GEN- The patient is well appearing, alert and oriented x 3 today.   Head- normocephalic, atraumatic Eyes-  Sclera clear, conjunctiva pink Ears- hearing intact Oropharynx- clear Neck- supple Lungs- CTA b/l, normal work of breathing Heart- RRR, no murmurs, rubs or gallops  GI- soft, NT, ND Extremities- no clubbing, cyanosis, or edema MS- no significant deformity or atrophy Skin- no rash or lesion Psych- euthymic mood, full affect   Labs:   Lab Results  Component Value Date   WBC 4.4 04/09/2018   HGB 12.9 04/09/2018   HCT 40.7 04/09/2018   MCV 92.9 04/09/2018   PLT 180 04/09/2018    Recent Labs  Lab 04/09/18 1045  NA 140  K 4.4  CL 108  CO2 27  BUN 12  CREATININE 0.77  CALCIUM 9.4  PROT 6.5  BILITOT 0.7  ALKPHOS 63  ALT 20  AST 23  GLUCOSE 106*   Lab Results  Component Value Date   CKTOTAL 94 01/24/2010   CKMB 1.3 01/24/2010   TROPONINI <0.03 04/10/2018   Lab Results  Component Value Date   CHOL 241 (H) 04/10/2018   CHOL  01/25/2010    162        ATP III CLASSIFICATION:  <200     mg/dL    Desirable  161-096  mg/dL   Borderline High  >=045    mg/dL   High          Lab Results  Component Value Date   HDL 75 04/10/2018   HDL 54 01/25/2010   Lab Results  Component Value Date  LDLCALC 155 (H) 04/10/2018   LDLCALC  01/25/2010    92        Total Cholesterol/HDL:CHD Risk Coronary Heart Disease Risk Table                     Men   Women  1/2 Average Risk   3.4   3.3  Average Risk       5.0   4.4  2 X Average Risk   9.6   7.1  3 X Average Risk  23.4   11.0        Use the calculated Patient Ratio above and the CHD Risk Table to determine the patient's CHD Risk.        ATP III CLASSIFICATION (LDL):  <100     mg/dL   Optimal  161-096  mg/dL   Near or Above                    Optimal  130-159  mg/dL   Borderline  045-409  mg/dL   High  >811     mg/dL   Very High   Lab Results  Component Value Date   TRIG 54 04/10/2018   TRIG 82 01/25/2010   Lab Results  Component Value Date   CHOLHDL 3.2 04/10/2018   CHOLHDL 3.0 01/25/2010   No results found for: LDLDIRECT  No results found for: DDIMER   Radiology/Studies:   Ct Angio Head W Or Wo Contrast Result Date: 04/09/2018 CLINICAL DATA:  Headaches and numbness of the left face and hand. Recent MRI showing left hemisphere infarct. EXAM: CT ANGIOGRAPHY HEAD AND NECK TECHNIQUE: Multidetector CT imaging of the head and neck was performed using the standard protocol during bolus administration of intravenous contrast. Multiplanar CT image reconstructions and MIPs were obtained to evaluate the vascular anatomy. Carotid stenosis measurements (when applicable) are obtained utilizing NASCET criteria, using the distal internal carotid diameter as the denominator. CONTRAST:  50mL ISOVUE-370 IOPAMIDOL (ISOVUE-370) INJECTION 76% COMPARISON:  01/26/2010 CTA head neck FINDINGS: CT HEAD FINDINGS Brain: There is no mass, hemorrhage or extra-axial collection. The size and configuration of the ventricles and extra-axial CSF spaces are  normal. There is an old right frontal lobe infarct. No evidence of acute infarct. The brain parenchyma is normal. Skull: The visualized skull base, calvarium and extracranial soft tissues are normal. Sinuses/Orbits: Partial opacification of the ethmoid air cells and left maxillary sinus. The orbits are normal. CTA NECK FINDINGS SKELETON: There is no bony spinal canal stenosis. No lytic or blastic lesion. OTHER NECK: Normal pharynx, larynx and major salivary glands. No cervical lymphadenopathy. Unremarkable thyroid gland. UPPER CHEST: No pneumothorax or pleural effusion. No nodules or masses. AORTIC ARCH: There is mild calcific atherosclerosis of the aortic arch. There is no aneurysm, dissection or hemodynamically significant stenosis of the visualized ascending aorta and aortic arch. Conventional 3 vessel aortic branching pattern. The visualized proximal subclavian arteries are widely patent. RIGHT CAROTID SYSTEM: --Common carotid artery: Widely patent origin without common carotid artery dissection or aneurysm. --Internal carotid artery: No dissection, occlusion or aneurysm. No hemodynamically significant stenosis. --External carotid artery: No acute abnormality. LEFT CAROTID SYSTEM: --Common carotid artery: Widely patent origin without common carotid artery dissection or aneurysm. --Internal carotid artery:There is noncalcified plaque at the left carotid bifurcation extending into the proximal left ICA. No associated stenosis. --External carotid artery: No acute abnormality. VERTEBRAL ARTERIES: Left dominant configuration. Both origins are normal. No dissection, occlusion or flow-limiting stenosis to the  vertebrobasilar confluence. CTA HEAD FINDINGS ANTERIOR CIRCULATION: --Intracranial internal carotid arteries: Normal. --Anterior cerebral arteries: Normal. Both A1 segments are present. Patent anterior communicating artery. --Middle cerebral arteries: Normal. --Posterior communicating arteries: Absent bilaterally.  POSTERIOR CIRCULATION: --Basilar artery: Normal. --Posterior cerebral arteries: Normal. --Superior cerebellar arteries: Normal. --Inferior cerebellar arteries: Normal anterior and posterior inferior cerebellar arteries. VENOUS SINUSES: As permitted by contrast timing, patent. ANATOMIC VARIANTS: None DELAYED PHASE: No parenchymal contrast enhancement. Review of the MIP images confirms the above findings. IMPRESSION: 1. Old right frontal lobe infarct without acute intracranial abnormality. 2. No emergent large vessel occlusion or hemodynamically significant stenosis. 3. Noncalcified plaque in the left carotid bifurcation and proximal left internal carotid artery without hemodynamically significant stenosis by NASCET criteria. 4. Mild aortic atherosclerosis (ICD10-I70.0). Electronically Signed   By: Deatra Robinson M.D.   On: 04/09/2018 13:47     Mr Brain Wo Contrast Addendum Date: 04/09/2018   ADDENDUM REPORT: 04/09/2018 10:01 ADDENDUM: Study discussed by telephone with Dr. Kateri Plummer, on call for Dr. Blair Heys on 04/09/2018 at 0952 hours. We agreed that the best course of action may be to have the patient seen in the nearby [facility] Emergency Department today to facilitate evaluation by Neurology. Electronically Signed   By: Odessa Fleming M.D.   On: 04/09/2018 10:01  Result Date: 04/09/2018 CLINICAL DATA:  62 year old female with left side headaches and sporadic numbness in the right hand for 4-6 weeks. Prior stroke in 2011. EXAM: MRI HEAD WITHOUT CONTRAST TECHNIQUE: Multiplanar, multiecho pulse sequences of the brain and surrounding structures were obtained without intravenous contrast. COMPARISON:  Brain MRI 09/01/2010. FINDINGS: Brain: Chronic right ACA territory ischemia and encephalomalacia with extension since the 2012 MRI, but no evidence of acute right hemisphere ischemia. There is associated chronic hemosiderin and mild ex vacuo enlargement of the right lateral ventricle. Patchy restricted diffusion in  the left lateral occiput (series 3, image 72) corresponding to the left MCA/PCA watershed. Additionally, there are scattered small patchy areas of left frontal and parietal cortical and corona radiata abnormality on trace diffusion (series 3, image 87) which appears isointense on ADC. T2 and FLAIR hyperintensity in these areas compatible with cytotoxic edema and/or developing encephalomalacia. No hemorrhage or mass effect. No other diffusion abnormality. Signal remains normal in the bilateral deep gray matter nuclei, brainstem, and cerebellum. No other chronic cerebral blood products identified. No midline shift, mass effect, evidence of mass lesion, ventriculomegaly, extra-axial collection or acute intracranial hemorrhage. Cervicomedullary junction and pituitary are within normal limits. Vascular: Major intracranial vascular flow voids are stable since 2012. Skull and upper cervical spine: Negative visible cervical spine. Visualized bone marrow signal is within normal limits. Sinuses/Orbits: Negative orbit soft tissues. Moderate chronic paranasal sinus mucosal thickening has not significantly changed since 2012. Other: Visible internal auditory structures appear normal. Mastoids remain clear. Scalp and face soft tissues appear negative. IMPRESSION: 1. Small areas of mixed acute and subacute ischemia in the left hemisphere. These are primarily affecting the watershed areas of the Left MCA/ACA and Left MCA/PCA. The most acute area is in the left lateral occiput. No associated hemorrhage or mass effect. 2. Chronic but progressed right ACA territory ischemia since the 2012 MRI. Associated chronic hemosiderin, encephalomalacia, and ex vacuo enlargement of the right lateral ventricle. Electronically Signed: By: Odessa Fleming M.D. On: 04/09/2018 09:50    Dg Chest Port 1 View Result Date: 04/10/2018 CLINICAL DATA:  62 year old female with chest pain. EXAM: PORTABLE CHEST 1 VIEW COMPARISON:  None. FINDINGS: The heart size  and mediastinal contours are within normal limits. Both lungs are clear. The visualized skeletal structures are unremarkable. IMPRESSION: No active disease. Electronically Signed   By: Elgie Collard M.D.   On: 04/10/2018 02:52    12-lead ECG SR All prior EKG's in EPIC reviewed with no documented atrial fibrillation  Telemetry SR  Assessment and Plan:  1. Cryptogenic stroke The patient presents with cryptogenic stroke.  The patient has a TEE planned for this AM.  I spoke at length with the patient about monitoring for afib with either a 30 day event monitor or an implantable loop recorder.  Risks, benefits, and alteratives to implantable loop recorder were discussed with the patient today.   At this time, the patient feels she would likely want to proceed with loop implant, though will like to discuss further with Dr. Ladona Ridgel prior to final decision.   Wound care was reviewed with the patient (keep incision clean and dry for 3 days).  Wound check will be scheduled for the patient  Please call with questions.   Renee Norberto Sorenson, PA-C 04/10/2018  EP Attending  Patient seen and examined. Agree with above. The patient has had 2 strokes and is now referred by Dr. Pearlean Brownie to consider insertion of an ILR. I have reviewed the findings as noted above. The indications/risks/benefits/goals/expectations of insertion of an ILR and she is willing to proceed.  Leonia Reeves.D.

## 2018-04-10 NOTE — Evaluation (Addendum)
Occupational Therapy Evaluation and Discharge Patient Details Name: Jamie Lambert MRN: [id number] DOB: 1955-06-30 Today's Date: 04/10/2018    History of Present Illness Ptis a 62 y.o. female is a 67 y/ female with a PMH CVA in 2011 d/t vascular spasm  with no no current residual, HTN, HLD. She presents to the ER after completing an outpatient MRI that revealed  mixed acute and subacute ischemia in the left hemisphere/watershed areas of the left MCA/ACA, most acute area is in L lateral occiuput. Apparently she'd been experiencing HA and right hand paraesthesias for the past couple of weeks. Prompting her to get evaluated for further workup. Subsequent her MRI finding she was called to come in for admission to have a  stroke workup completed.    Clinical Impression   PTA patient independent and driving.  Currently admitted for above and limited by impaired sensation and  in R hand (intermittently numb) and B UE proprioception, but functional.  Patient is at independent level for ADLs and mobility, with no deficits noted in cognition, vision, and coordination.  At this time, no further OT needs identified and OT is signing off.  If further needs arise, please re-consult.  Thank you for this referral.     Follow Up Recommendations  No OT follow up    Equipment Recommendations  None recommended by OT    Recommendations for Other Services       Precautions / Restrictions Restrictions Weight Bearing Restrictions: No      Mobility Bed Mobility Overal bed mobility: Independent                Transfers Overall transfer level: Independent Equipment used: None                  Balance Overall balance assessment: No apparent balance deficits (not formally assessed)(completed mobility with head turns and no LOB)                                         ADL either performed or assessed with clinical judgement   ADL Overall ADL's : At baseline;Independent                                        General ADL Comments: Pt demonstrates independent level of self care, no impairments noted.     Vision Baseline Vision/History: Wears glasses Wears Glasses: At all times Patient Visual Report: No change from baseline Vision Assessment?: Yes Eye Alignment: Within Functional Limits Ocular Range of Motion: Within Functional Limits Alignment/Gaze Preference: Within Defined Limits Tracking/Visual Pursuits: Able to track stimulus in all quads without difficulty Saccades: Within functional limits Convergence: Within functional limits Visual Fields: No apparent deficits     Perception     Praxis      Pertinent Vitals/Pain Pain Assessment: No/denies pain     Hand Dominance Right   Extremity/Trunk Assessment Upper Extremity Assessment Upper Extremity Assessment: RUE deficits/detail RUE Deficits / Details: WFL, reports intermittent numbness in R hand digits 4-5 RUE Sensation: WNL;decreased proprioception(WNL light touch, noted difficulty B location isolation) RUE Coordination: WNL   Lower Extremity Assessment Lower Extremity Assessment: Overall WFL for tasks assessed   Cervical / Trunk Assessment Cervical / Trunk Assessment: Normal   Communication Communication Communication: No difficulties   Cognition Arousal/Alertness: Awake/alert Behavior  During Therapy: WFL for tasks assessed/performed Overall Cognitive Status: Within Functional Limits for tasks assessed                                     General Comments       Exercises     Shoulder Instructions      Home Living Family/patient expects to be discharged to:: Private residence Living Arrangements: Spouse/significant other Available Help at Discharge: Family;Available 24 hours/day Type of Home: House Home Access: Stairs to enter [employer] of Steps: 5 Entrance Stairs-Rails: Right Home Layout: Two level Alternate Level  Stairs-Number of Steps: flight   Bathroom Shower/Tub: Producer, television/film/video: Standard     Home Equipment: None          Prior Functioning/Environment Level of Independence: Independent        Comments: 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         OT Problem List: Impaired sensation      OT Treatment/Interventions:      OT Goals(Current goals can be found in the care plan section) Acute Rehab OT Goals Patient Stated Goal: to figure out why I'm having strokes OT Goal Formulation: With patient  OT Frequency:     Barriers to D/C:            Co-evaluation              AM-PAC PT "6 Clicks" Daily Activity     Outcome Measure Help from another person eating meals?: None Help from another person taking care of personal grooming?: None Help from another person toileting, which includes using toliet, bedpan, or urinal?: None Help from another person bathing (including washing, rinsing, drying)?: None Help from another person to put on and taking off regular upper body clothing?: None Help from another person to put on and taking off regular lower body clothing?: None 6 Click Score: 24   End of Session Nurse Communication: Mobility status  Activity Tolerance: Patient tolerated treatment well Patient left: Other (comment)(transport arrived )  OT Visit Diagnosis: Other symptoms and signs involving the nervous system (R29.898)                Time: 1610-9604 OT Time Calculation (min): 10 min Charges:  OT General Charges $OT Visit: 1 Visit OT Evaluation $OT Eval Low Complexity: 1 Low  Jamie Lambert, OT Acute Rehabilitation Services Pager [phone] Office [phone]   Jamie Lambert 04/10/2018, 8:49 AM

## 2018-04-10 NOTE — Progress Notes (Signed)
PROGRESS NOTE    Jamie Lambert  ZOX:[id number] DOB: 04-06-1956 DOA: 04/09/2018 PCP: Blair Heys, MD    Brief Narrative:  Jamie Lambert is a 62 y.o. female with medical history significant of hyperlipidemia, prior CVA in 2011 with no residual weakness, who presents to the hospital with chief complaint of intermittent headaches for the past several weeks as well as intermittent right hand numbness for the past couple weeks.  Patient has a history of CVA back in 2011 which resulted in some residual left-sided weakness which is currently completely resolved.  She underwent an MRI this morning as an outpatient this PCP was concerned, it showed mixed acute and subacute ischemia in the left hemisphere/watershed areas of the left MCA/ACA, she was called to, present to the emergency room. She was admitted for acute CVA.   Assessment & Plan:   Active Problems:   CVA (cerebral vascular accident) (HCC)   Left acute/subacute watershed infarct in setting of proir R watershed infarct in 2011, both felt to be embolic secondary to unknown source. CT angio of the neck and head done showed old right frontal infarct.  TEE done in 2011 is negative.  Echocardiogram done and is pending.  LDL is 155, and hga1c is 5. She will need outpatient eval for PCSK9, will need to be evaluated by Dr Manus Gunning.  Cardiology requested for TEE and loop recorder.  Therapy evals done.  On aspirin 81 mg daily  and plavix for 3 weeks.  Permissive hypertension.  Neurology consulted and recommendations given.    DVT prophylaxis: lovenox.  Code Status: full code.  Family Communication: family at bedside.  Disposition Plan: PENDING EVAL.    Consultants:   Neurology  Cardiology for TEE.   Procedures: Echocardiogram.   Antimicrobials: none.    Subjective: No chest pain or sob,   Objective: Vitals:   04/09/18 2338 04/10/18 0211 04/10/18 0407 04/10/18 0734  BP: 138/70 (!) 149/58 140/66 140/64  Pulse: (!) 49  60 (!) 48 (!) 55  Resp: 18 18 18 18   Temp: (!) 97.5 F (36.4 C) 97.8 F (36.6 C) 97.9 F (36.6 C) 98.2 F (36.8 C)  TempSrc: Oral Oral Oral Oral  SpO2: 97% 99% 98% 97%  Weight:      Height:       No intake or output data in the 24 hours ending 04/10/18 1212 Filed Weights   04/09/18 1039  Weight: 63.5 kg    Examination:  General exam: Appears calm and comfortable  Respiratory system: Clear to auscultation. Respiratory effort normal. Cardiovascular system: S1 & S2 heard, RRR. No JVD, murmurs,No pedal edema. Gastrointestinal system: Abdomen is nondistended, soft and nontender. No organomegaly or masses felt. Normal bowel sounds heard. Central nervous system: Alert and oriented. No focal neurological deficits. Extremities: Symmetric 5 x 5 power. Skin: No rashes, lesions or ulcers Psychiatry: Judgement and insight appear normal. Mood & affect appropriate.     Data Reviewed: I have personally reviewed following labs and imaging studies  CBC: Recent Labs  Lab 04/09/18 1045  WBC 4.4  NEUTROABS 1.5*  HGB 12.9  HCT 40.7  MCV 92.9  PLT 180   Basic Metabolic Panel: Recent Labs  Lab 04/09/18 1045  NA 140  K 4.4  CL 108  CO2 27  GLUCOSE 106*  BUN 12  CREATININE 0.77  CALCIUM 9.4   GFR: Estimated Creatinine Clearance: 71.8 mL/min (by C-G formula based on SCr of 0.77 mg/dL). Liver Function Tests: Recent Labs  Lab 04/09/18 1045  AST 23  ALT 20  ALKPHOS 63  BILITOT 0.7  PROT 6.5  ALBUMIN 3.7   No results for input(s): LIPASE, AMYLASE in the last 168 hours. No results for input(s): AMMONIA in the last 168 hours. Coagulation Profile: Recent Labs  Lab 04/09/18 1045  INR 0.99   Cardiac Enzymes: Recent Labs  Lab 04/10/18 0226  TROPONINI <0.03   BNP (last 3 results) No results for input(s): PROBNP in the last 8760 hours. HbA1C: Recent Labs    04/10/18 0226  HGBA1C 5.0   CBG: Recent Labs  Lab 04/09/18 1300  GLUCAP 89   Lipid Profile: Recent  Labs    04/10/18 0226  CHOL 241*  HDL 75  LDLCALC 155*  TRIG 54  CHOLHDL 3.2   Thyroid Function Tests: No results for input(s): TSH, T4TOTAL, FREET4, T3FREE, THYROIDAB in the last 72 hours. Anemia Panel: No results for input(s): VITAMINB12, FOLATE, FERRITIN, TIBC, IRON, RETICCTPCT in the last 72 hours. Sepsis Labs: No results for input(s): PROCALCITON, LATICACIDVEN in the last 168 hours.  No results found for this or any previous visit (from the past 240 hour(s)).       Radiology Studies: Ct Angio Head W Or Wo Contrast  Result Date: 04/09/2018 CLINICAL DATA:  Headaches and numbness of the left face and hand. Recent MRI showing left hemisphere infarct. EXAM: CT ANGIOGRAPHY HEAD AND NECK TECHNIQUE: Multidetector CT imaging of the head and neck was performed using the standard protocol during bolus administration of intravenous contrast. Multiplanar CT image reconstructions and MIPs were obtained to evaluate the vascular anatomy. Carotid stenosis measurements (when applicable) are obtained utilizing NASCET criteria, using the distal internal carotid diameter as the denominator. CONTRAST:  50mL ISOVUE-370 IOPAMIDOL (ISOVUE-370) INJECTION 76% COMPARISON:  01/26/2010 CTA head neck FINDINGS: CT HEAD FINDINGS Brain: There is no mass, hemorrhage or extra-axial collection. The size and configuration of the ventricles and extra-axial CSF spaces are normal. There is an old right frontal lobe infarct. No evidence of acute infarct. The brain parenchyma is normal. Skull: The visualized skull base, calvarium and extracranial soft tissues are normal. Sinuses/Orbits: Partial opacification of the ethmoid air cells and left maxillary sinus. The orbits are normal. CTA NECK FINDINGS SKELETON: There is no bony spinal canal stenosis. No lytic or blastic lesion. OTHER NECK: Normal pharynx, larynx and major salivary glands. No cervical lymphadenopathy. Unremarkable thyroid gland. UPPER CHEST: No pneumothorax or  pleural effusion. No nodules or masses. AORTIC ARCH: There is mild calcific atherosclerosis of the aortic arch. There is no aneurysm, dissection or hemodynamically significant stenosis of the visualized ascending aorta and aortic arch. Conventional 3 vessel aortic branching pattern. The visualized proximal subclavian arteries are widely patent. RIGHT CAROTID SYSTEM: --Common carotid artery: Widely patent origin without common carotid artery dissection or aneurysm. --Internal carotid artery: No dissection, occlusion or aneurysm. No hemodynamically significant stenosis. --External carotid artery: No acute abnormality. LEFT CAROTID SYSTEM: --Common carotid artery: Widely patent origin without common carotid artery dissection or aneurysm. --Internal carotid artery:There is noncalcified plaque at the left carotid bifurcation extending into the proximal left ICA. No associated stenosis. --External carotid artery: No acute abnormality. VERTEBRAL ARTERIES: Left dominant configuration. Both origins are normal. No dissection, occlusion or flow-limiting stenosis to the vertebrobasilar confluence. CTA HEAD FINDINGS ANTERIOR CIRCULATION: --Intracranial internal carotid arteries: Normal. --Anterior cerebral arteries: Normal. Both A1 segments are present. Patent anterior communicating artery. --Middle cerebral arteries: Normal. --Posterior communicating arteries: Absent bilaterally. POSTERIOR CIRCULATION: --Basilar artery: Normal. --Posterior cerebral arteries: Normal. --Superior cerebellar arteries:  Normal. --Inferior cerebellar arteries: Normal anterior and posterior inferior cerebellar arteries. VENOUS SINUSES: As permitted by contrast timing, patent. ANATOMIC VARIANTS: None DELAYED PHASE: No parenchymal contrast enhancement. Review of the MIP images confirms the above findings. IMPRESSION: 1. Old right frontal lobe infarct without acute intracranial abnormality. 2. No emergent large vessel occlusion or hemodynamically  significant stenosis. 3. Noncalcified plaque in the left carotid bifurcation and proximal left internal carotid artery without hemodynamically significant stenosis by NASCET criteria. 4. Mild aortic atherosclerosis (ICD10-I70.0). Electronically Signed   By: Deatra Robinson M.D.   On: 04/09/2018 13:47   Ct Angio Neck W And/or Wo Contrast  Result Date: 04/09/2018 CLINICAL DATA:  Headaches and numbness of the left face and hand. Recent MRI showing left hemisphere infarct. EXAM: CT ANGIOGRAPHY HEAD AND NECK TECHNIQUE: Multidetector CT imaging of the head and neck was performed using the standard protocol during bolus administration of intravenous contrast. Multiplanar CT image reconstructions and MIPs were obtained to evaluate the vascular anatomy. Carotid stenosis measurements (when applicable) are obtained utilizing NASCET criteria, using the distal internal carotid diameter as the denominator. CONTRAST:  50mL ISOVUE-370 IOPAMIDOL (ISOVUE-370) INJECTION 76% COMPARISON:  01/26/2010 CTA head neck FINDINGS: CT HEAD FINDINGS Brain: There is no mass, hemorrhage or extra-axial collection. The size and configuration of the ventricles and extra-axial CSF spaces are normal. There is an old right frontal lobe infarct. No evidence of acute infarct. The brain parenchyma is normal. Skull: The visualized skull base, calvarium and extracranial soft tissues are normal. Sinuses/Orbits: Partial opacification of the ethmoid air cells and left maxillary sinus. The orbits are normal. CTA NECK FINDINGS SKELETON: There is no bony spinal canal stenosis. No lytic or blastic lesion. OTHER NECK: Normal pharynx, larynx and major salivary glands. No cervical lymphadenopathy. Unremarkable thyroid gland. UPPER CHEST: No pneumothorax or pleural effusion. No nodules or masses. AORTIC ARCH: There is mild calcific atherosclerosis of the aortic arch. There is no aneurysm, dissection or hemodynamically significant stenosis of the visualized ascending  aorta and aortic arch. Conventional 3 vessel aortic branching pattern. The visualized proximal subclavian arteries are widely patent. RIGHT CAROTID SYSTEM: --Common carotid artery: Widely patent origin without common carotid artery dissection or aneurysm. --Internal carotid artery: No dissection, occlusion or aneurysm. No hemodynamically significant stenosis. --External carotid artery: No acute abnormality. LEFT CAROTID SYSTEM: --Common carotid artery: Widely patent origin without common carotid artery dissection or aneurysm. --Internal carotid artery:There is noncalcified plaque at the left carotid bifurcation extending into the proximal left ICA. No associated stenosis. --External carotid artery: No acute abnormality. VERTEBRAL ARTERIES: Left dominant configuration. Both origins are normal. No dissection, occlusion or flow-limiting stenosis to the vertebrobasilar confluence. CTA HEAD FINDINGS ANTERIOR CIRCULATION: --Intracranial internal carotid arteries: Normal. --Anterior cerebral arteries: Normal. Both A1 segments are present. Patent anterior communicating artery. --Middle cerebral arteries: Normal. --Posterior communicating arteries: Absent bilaterally. POSTERIOR CIRCULATION: --Basilar artery: Normal. --Posterior cerebral arteries: Normal. --Superior cerebellar arteries: Normal. --Inferior cerebellar arteries: Normal anterior and posterior inferior cerebellar arteries. VENOUS SINUSES: As permitted by contrast timing, patent. ANATOMIC VARIANTS: None DELAYED PHASE: No parenchymal contrast enhancement. Review of the MIP images confirms the above findings. IMPRESSION: 1. Old right frontal lobe infarct without acute intracranial abnormality. 2. No emergent large vessel occlusion or hemodynamically significant stenosis. 3. Noncalcified plaque in the left carotid bifurcation and proximal left internal carotid artery without hemodynamically significant stenosis by NASCET criteria. 4. Mild aortic atherosclerosis  (ICD10-I70.0). Electronically Signed   By: Deatra Robinson M.D.   On: 04/09/2018 13:47  Mr Brain Wo Contrast  Addendum Date: 04/09/2018   ADDENDUM REPORT: 04/09/2018 10:01 ADDENDUM: Study discussed by telephone with Dr. Kateri Plummer, on call for Dr. Blair Heys on 04/09/2018 at 0952 hours. We agreed that the best course of action may be to have the patient seen in the nearby [facility] Emergency Department today to facilitate evaluation by Neurology. Electronically Signed   By: Odessa Fleming M.D.   On: 04/09/2018 10:01   Result Date: 04/09/2018 CLINICAL DATA:  62 year old female with left side headaches and sporadic numbness in the right hand for 4-6 weeks. Prior stroke in 2011. EXAM: MRI HEAD WITHOUT CONTRAST TECHNIQUE: Multiplanar, multiecho pulse sequences of the brain and surrounding structures were obtained without intravenous contrast. COMPARISON:  Brain MRI 09/01/2010. FINDINGS: Brain: Chronic right ACA territory ischemia and encephalomalacia with extension since the 2012 MRI, but no evidence of acute right hemisphere ischemia. There is associated chronic hemosiderin and mild ex vacuo enlargement of the right lateral ventricle. Patchy restricted diffusion in the left lateral occiput (series 3, image 72) corresponding to the left MCA/PCA watershed. Additionally, there are scattered small patchy areas of left frontal and parietal cortical and corona radiata abnormality on trace diffusion (series 3, image 87) which appears isointense on ADC. T2 and FLAIR hyperintensity in these areas compatible with cytotoxic edema and/or developing encephalomalacia. No hemorrhage or mass effect. No other diffusion abnormality. Signal remains normal in the bilateral deep gray matter nuclei, brainstem, and cerebellum. No other chronic cerebral blood products identified. No midline shift, mass effect, evidence of mass lesion, ventriculomegaly, extra-axial collection or acute intracranial hemorrhage. Cervicomedullary junction and  pituitary are within normal limits. Vascular: Major intracranial vascular flow voids are stable since 2012. Skull and upper cervical spine: Negative visible cervical spine. Visualized bone marrow signal is within normal limits. Sinuses/Orbits: Negative orbit soft tissues. Moderate chronic paranasal sinus mucosal thickening has not significantly changed since 2012. Other: Visible internal auditory structures appear normal. Mastoids remain clear. Scalp and face soft tissues appear negative. IMPRESSION: 1. Small areas of mixed acute and subacute ischemia in the left hemisphere. These are primarily affecting the watershed areas of the Left MCA/ACA and Left MCA/PCA. The most acute area is in the left lateral occiput. No associated hemorrhage or mass effect. 2. Chronic but progressed right ACA territory ischemia since the 2012 MRI. Associated chronic hemosiderin, encephalomalacia, and ex vacuo enlargement of the right lateral ventricle. Electronically Signed: By: Odessa Fleming M.D. On: 04/09/2018 09:50   Dg Chest Port 1 View  Result Date: 04/10/2018 CLINICAL DATA:  62 year old female with chest pain. EXAM: PORTABLE CHEST 1 VIEW COMPARISON:  None. FINDINGS: The heart size and mediastinal contours are within normal limits. Both lungs are clear. The visualized skeletal structures are unremarkable. IMPRESSION: No active disease. Electronically Signed   By: Elgie Collard M.D.   On: 04/10/2018 02:52        Scheduled Meds: . aspirin EC  81 mg Oral Daily  . clopidogrel  75 mg Oral Daily  . enoxaparin (LOVENOX) injection  40 mg Subcutaneous QHS  . multivitamin  1 tablet Oral Daily  . multivitamin with minerals  1 tablet Oral Daily   Continuous Infusions:   LOS: 1 day    Time spent: 35 minutes    Kathlen Mody, MD [facility] Pager [phone] If 7PM-7AM, please contact night-coverage [email or web address] Password [email or web address] 04/10/2018, 12:12 PM

## 2018-04-10 NOTE — Evaluation (Signed)
Physical Therapy Evaluation Patient Details Name: Jamie Lambert MRN: [id number] DOB: [birth year] Today's Date: 04/10/2018   History of Present Illness  Ptis a 62 y.o. female is a 58 y/ female with a PMH CVA in 2011 d/t vascular spasm  with no no current residual, HTN, HLD. She presents to the ER after completing an outpatient MRI that revealed  mixed acute and subacute ischemia in the left hemisphere/watershed areas of the left MCA/ACA, most acute area is in L lateral occiuput. Apparently she'd been experiencing HA and right hand paraesthesias for the past couple of weeks. Prompting her to get evaluated for further workup. Subsequent her MRI finding she was called to come in for admission to have a  stroke workup completed.   Clinical Impression  Pt currently performing all mobility independently.  Discussed remaining mobile while in hospital, ambulating in hallways, to/from bathroom, etc., and remaining active at d/c.  Pt with active lifestyle prior to admission and plans to return to such.  No further PT needs identified acutely, and no f/u needed.  Will sign off at this time.      Follow Up Recommendations No PT follow up    Equipment Recommendations  None recommended by PT    Recommendations for Other Services       Precautions / Restrictions Precautions Precautions: None Restrictions Weight Bearing Restrictions: No      Mobility  Bed Mobility Overal bed mobility: Independent                Transfers Overall transfer level: Independent Equipment used: None                Ambulation/Gait Ambulation/Gait assistance: Independent Gait Distance (Feet): 300 Feet Assistive device: None Gait Pattern/deviations: WFL(Within Functional Limits)   Gait velocity interpretation: >2.62 ft/sec, indicative of community ambulatory    Stairs            Wheelchair Mobility    Modified Rankin (Stroke Patients Only)       Balance Overall balance assessment:  Independent                                           Pertinent Vitals/Pain Pain Assessment: No/denies pain    Home Living Family/patient expects to be discharged to:: Private residence Living Arrangements: Spouse/significant other Available Help at Discharge: Family;Available 24 hours/day Type of Home: House Home Access: Stairs to enter Entrance Stairs-Rails: Right Entrance Stairs-Number of Steps: 5 Home Layout: Two level Home Equipment: None Additional Comments: no DME used PTA    Prior Function Level of Independence: Independent         Comments: very active at baseline     Hand Dominance   Dominant Hand: Right    Extremity/Trunk Assessment   Upper Extremity Assessment Upper Extremity Assessment: RUE deficits/detail RUE Deficits / Details: WFL, reports intermittent numbness in R hand digits 4-5 RUE Sensation: WNL;decreased proprioception(WNL light touch, noted difficulty B location isolation) RUE Coordination: WNL    Lower Extremity Assessment Lower Extremity Assessment: Overall WFL for tasks assessed(no deficits noted)    Cervical / Trunk Assessment Cervical / Trunk Assessment: Normal  Communication   Communication: No difficulties  Cognition Arousal/Alertness: Awake/alert Behavior During Therapy: WFL for tasks assessed/performed Overall Cognitive Status: Within Functional Limits for tasks assessed  General Comments      Exercises     Assessment/Plan    PT Assessment Patent does not need any further PT services  PT Problem List         PT Treatment Interventions      PT Goals (Current goals can be found in the Care Plan section)  Acute Rehab PT Goals Patient Stated Goal: none stated    Frequency     Barriers to discharge        Co-evaluation               AM-PAC PT "6 Clicks" Daily Activity  Outcome Measure Difficulty turning over in bed (including  adjusting bedclothes, sheets and blankets)?: None Difficulty moving from lying on back to sitting on the side of the bed? : None Difficulty sitting down on and standing up from a chair with arms (e.g., wheelchair, bedside commode, etc,.)?: None Help needed moving to and from a bed to chair (including a wheelchair)?: None Help needed walking in hospital room?: None Help needed climbing 3-5 steps with a railing? : None 6 Click Score: 24    End of Session     Patient left: in bed;with call bell/phone within reach;with family/visitor present Nurse Communication: Mobility status      Time: 1005-1015 PT Time Calculation (min) (ACUTE ONLY): 10 min   Charges:   PT Evaluation $PT Eval Low Complexity: 1 Low            Stephania Fragmin 04/10/2018, 10:18 AM

## 2018-04-10 NOTE — Progress Notes (Signed)
Pt chest pain has subsided at this time. Jorge Ny

## 2018-04-10 NOTE — Progress Notes (Signed)
Tele monitor removed. IV removed, site clean dry and intact. Education provided to patient and family. Patient discharged from facility via wheelchair.  Enzo Bi RN

## 2018-04-11 ENCOUNTER — Telehealth: Payer: Self-pay | Admitting: Internal Medicine

## 2018-04-11 ENCOUNTER — Encounter (HOSPITAL_COMMUNITY): Payer: Self-pay | Admitting: Internal Medicine

## 2018-04-11 SURGERY — ECHOCARDIOGRAM, TRANSESOPHAGEAL
Anesthesia: Monitor Anesthesia Care

## 2018-04-11 NOTE — Telephone Encounter (Signed)
Called patient regarding the bleeding she experienced from her ILR site last night. Patient states that she hasn't noticed any bleeding so far today. Patient wanted to know if she should remove her Tegaderm dressing as directed by her instruction sheet. I told patient that she can remove her Tegaderm dressing, but she is to keep the steri strips in place until her wound check appointment. I also instructed patient to apply manual pressure to her site if she notices anymore bleeding. Patient verbalized understanding of all information.

## 2018-04-11 NOTE — Telephone Encounter (Signed)
   1. Has your device fired? no  2. Is you device beeping? no  3. Are you experiencing draining or swelling at device site? Had some bleed through this morning  4. Are you calling to see if we received your device transmission? no  5. Have you passed out? no  Patient had loop recorder inserted and was released from hospital last night. Patient is calling because she had some bleeding last night from incision site and wasn't sure if this was normal. She also wanted to know if she should leave the bandage on or take it off.   Please route to Device Clinic Pool

## 2018-04-12 NOTE — Discharge Summary (Signed)
Physician Discharge Summary  Jamie Lambert ZOX:[id number] DOB: 1955-08-28 DOA: 04/09/2018  PCP: Jamie Heys, MD  Admit date: 04/09/2018 Discharge date: 04/10/2018  Admitted From: Home Disposition:  Home.   Recommendations for Outpatient Follow-up:  1. Follow up with PCP in 1-2 weeks 2. Please obtain BMP/CBC in one week Please follow up with neurology and cardiology as recommended.   Discharge Condition:stable.  CODE STATUS:full code.  Diet recommendation: Heart Healthy   Brief/Interim Summary: Jamie Nakayama Boelhoweris a 62 y.o.femalewith medical history significant ofhyperlipidemia, prior CVA in 2011 with no residual weakness, who presents to the hospital with chief complaint of intermittent headaches for the past several weeks as well as intermittent right hand numbness for the past couple weeks. Patient has a history of CVA back in 2011 which resulted in some residual left-sided weakness which is currently completely resolved. She underwent an MRI this morning as an outpatient this PCP was concerned, it showed mixed acute and subacute ischemia in the left hemisphere/watershed areas of the left MCA/ACA, she was called to, present to the emergency room. She was admitted for acute CVA.   Discharge Diagnoses:  Active Problems:   CVA (cerebral vascular accident) (HCC)  Left acute/subacute watershedinfarct in setting of proir R watershed infarct in 2011, both felt to beembolic secondary tounknownsource. CT angio of the neck and head done showed old right frontal infarct.  TEE done in 2011 is negative.  Echocardiogram done and reviewed with the patient.  LDL is 155, and hga1c is 5. She will need outpatient eval for PCSK9, will need to be evaluated by Dr Manus Gunning.  Cardiology requested for  loop recorder, it was placed. .  Therapy evals done.  On aspirin 81 mg daily  and plavix for 3 weeks.  Permissive hypertension.  Neurology consulted and recommendations given.  recommend  outpatient follow up with neurology and cardiology.   Discharge Instructions  Discharge Instructions    Diet - low sodium heart healthy   Complete by:  As directed    Diet - low sodium heart healthy   Complete by:  As directed    Discharge instructions   Complete by:  As directed    Please follow up with cardiology and neurology as recommended.     Allergies as of 04/10/2018      Reactions   Prednisone Hives      Medication List    TAKE these medications   acetaminophen 325 MG tablet Commonly known as:  TYLENOL Take 325 mg by mouth every 6 (six) hours as needed for mild pain.   aspirin 81 MG EC tablet Take 1 tablet (81 mg total) by mouth daily.   beta carotene w/minerals tablet Take 1 tablet by mouth daily.   clopidogrel 75 MG tablet Commonly known as:  PLAVIX Take 1 tablet (75 mg total) by mouth daily.   diazepam 5 MG tablet Commonly known as:  VALIUM Take 5-10 mg by mouth See admin instructions. Take 1 to 2 tablets by mouth 30-60 minutes before MRI   multivitamin capsule Take 1 capsule by mouth daily.      Follow-up Information    [facility] [address] [facility] Follow up on 04/25/2018.   Specialty:  Cardiology Why:  11:00AM, wound check visit Contact information: [address] [place] [address] [phone]       Jamie Heys, MD. Schedule an appointment as soon as possible for a visit in 1 week(s).   Specialty:  Family Medicine Contact information:  [address] [place] [address] [phone]        Jamie Riley, MD. Schedule an appointment as soon as possible for a visit in 3 week(s).   Specialties:  Neurology, Radiology Contact information: [address] [place] [address] [phone]          Allergies  Allergen Reactions  . Prednisone Hives    Consultations:   Neurology  Cardiology.   Procedures/Studies: Ct Angio Head W Or Wo Contrast  Result  Date: 04/09/2018 CLINICAL DATA:  Headaches and numbness of the left face and hand. Recent MRI showing left hemisphere infarct. EXAM: CT ANGIOGRAPHY HEAD AND NECK TECHNIQUE: Multidetector CT imaging of the head and neck was performed using the standard protocol during bolus administration of intravenous contrast. Multiplanar CT image reconstructions and MIPs were obtained to evaluate the vascular anatomy. Carotid stenosis measurements (when applicable) are obtained utilizing NASCET criteria, using the distal internal carotid diameter as the denominator. CONTRAST:  50mL ISOVUE-370 IOPAMIDOL (ISOVUE-370) INJECTION 76% COMPARISON:  01/26/2010 CTA head neck FINDINGS: CT HEAD FINDINGS Brain: There is no mass, hemorrhage or extra-axial collection. The size and configuration of the ventricles and extra-axial CSF spaces are normal. There is an old right frontal lobe infarct. No evidence of acute infarct. The brain parenchyma is normal. Skull: The visualized skull base, calvarium and extracranial soft tissues are normal. Sinuses/Orbits: Partial opacification of the ethmoid air cells and left maxillary sinus. The orbits are normal. CTA NECK FINDINGS SKELETON: There is no bony spinal canal stenosis. No lytic or blastic lesion. OTHER NECK: Normal pharynx, larynx and major salivary glands. No cervical lymphadenopathy. Unremarkable thyroid gland. UPPER CHEST: No pneumothorax or pleural effusion. No nodules or masses. AORTIC ARCH: There is mild calcific atherosclerosis of the aortic arch. There is no aneurysm, dissection or hemodynamically significant stenosis of the visualized ascending aorta and aortic arch. Conventional 3 vessel aortic branching pattern. The visualized proximal subclavian arteries are widely patent. RIGHT CAROTID SYSTEM: --Common carotid artery: Widely patent origin without common carotid artery dissection or aneurysm. --Internal carotid artery: No dissection, occlusion or aneurysm. No hemodynamically  significant stenosis. --External carotid artery: No acute abnormality. LEFT CAROTID SYSTEM: --Common carotid artery: Widely patent origin without common carotid artery dissection or aneurysm. --Internal carotid artery:There is noncalcified plaque at the left carotid bifurcation extending into the proximal left ICA. No associated stenosis. --External carotid artery: No acute abnormality. VERTEBRAL ARTERIES: Left dominant configuration. Both origins are normal. No dissection, occlusion or flow-limiting stenosis to the vertebrobasilar confluence. CTA HEAD FINDINGS ANTERIOR CIRCULATION: --Intracranial internal carotid arteries: Normal. --Anterior cerebral arteries: Normal. Both A1 segments are present. Patent anterior communicating artery. --Middle cerebral arteries: Normal. --Posterior communicating arteries: Absent bilaterally. POSTERIOR CIRCULATION: --Basilar artery: Normal. --Posterior cerebral arteries: Normal. --Superior cerebellar arteries: Normal. --Inferior cerebellar arteries: Normal anterior and posterior inferior cerebellar arteries. VENOUS SINUSES: As permitted by contrast timing, patent. ANATOMIC VARIANTS: None DELAYED PHASE: No parenchymal contrast enhancement. Review of the MIP images confirms the above findings. IMPRESSION: 1. Old right frontal lobe infarct without acute intracranial abnormality. 2. No emergent large vessel occlusion or hemodynamically significant stenosis. 3. Noncalcified plaque in the left carotid bifurcation and proximal left internal carotid artery without hemodynamically significant stenosis by NASCET criteria. 4. Mild aortic atherosclerosis (ICD10-I70.0). Electronically Signed   By: Deatra Robinson M.D.   On: 04/09/2018 13:47   Ct Angio Neck W And/or Wo Contrast  Result Date: 04/09/2018 CLINICAL DATA:  Headaches and numbness of the left face  and hand. Recent MRI showing left hemisphere infarct. EXAM: CT ANGIOGRAPHY HEAD AND NECK TECHNIQUE: Multidetector CT imaging of the head  and neck was performed using the standard protocol during bolus administration of intravenous contrast. Multiplanar CT image reconstructions and MIPs were obtained to evaluate the vascular anatomy. Carotid stenosis measurements (when applicable) are obtained utilizing NASCET criteria, using the distal internal carotid diameter as the denominator. CONTRAST:  50mL ISOVUE-370 IOPAMIDOL (ISOVUE-370) INJECTION 76% COMPARISON:  01/26/2010 CTA head neck FINDINGS: CT HEAD FINDINGS Brain: There is no mass, hemorrhage or extra-axial collection. The size and configuration of the ventricles and extra-axial CSF spaces are normal. There is an old right frontal lobe infarct. No evidence of acute infarct. The brain parenchyma is normal. Skull: The visualized skull base, calvarium and extracranial soft tissues are normal. Sinuses/Orbits: Partial opacification of the ethmoid air cells and left maxillary sinus. The orbits are normal. CTA NECK FINDINGS SKELETON: There is no bony spinal canal stenosis. No lytic or blastic lesion. OTHER NECK: Normal pharynx, larynx and major salivary glands. No cervical lymphadenopathy. Unremarkable thyroid gland. UPPER CHEST: No pneumothorax or pleural effusion. No nodules or masses. AORTIC ARCH: There is mild calcific atherosclerosis of the aortic arch. There is no aneurysm, dissection or hemodynamically significant stenosis of the visualized ascending aorta and aortic arch. Conventional 3 vessel aortic branching pattern. The visualized proximal subclavian arteries are widely patent. RIGHT CAROTID SYSTEM: --Common carotid artery: Widely patent origin without common carotid artery dissection or aneurysm. --Internal carotid artery: No dissection, occlusion or aneurysm. No hemodynamically significant stenosis. --External carotid artery: No acute abnormality. LEFT CAROTID SYSTEM: --Common carotid artery: Widely patent origin without common carotid artery dissection or aneurysm. --Internal carotid  artery:There is noncalcified plaque at the left carotid bifurcation extending into the proximal left ICA. No associated stenosis. --External carotid artery: No acute abnormality. VERTEBRAL ARTERIES: Left dominant configuration. Both origins are normal. No dissection, occlusion or flow-limiting stenosis to the vertebrobasilar confluence. CTA HEAD FINDINGS ANTERIOR CIRCULATION: --Intracranial internal carotid arteries: Normal. --Anterior cerebral arteries: Normal. Both A1 segments are present. Patent anterior communicating artery. --Middle cerebral arteries: Normal. --Posterior communicating arteries: Absent bilaterally. POSTERIOR CIRCULATION: --Basilar artery: Normal. --Posterior cerebral arteries: Normal. --Superior cerebellar arteries: Normal. --Inferior cerebellar arteries: Normal anterior and posterior inferior cerebellar arteries. VENOUS SINUSES: As permitted by contrast timing, patent. ANATOMIC VARIANTS: None DELAYED PHASE: No parenchymal contrast enhancement. Review of the MIP images confirms the above findings. IMPRESSION: 1. Old right frontal lobe infarct without acute intracranial abnormality. 2. No emergent large vessel occlusion or hemodynamically significant stenosis. 3. Noncalcified plaque in the left carotid bifurcation and proximal left internal carotid artery without hemodynamically significant stenosis by NASCET criteria. 4. Mild aortic atherosclerosis (ICD10-I70.0). Electronically Signed   By: Deatra Robinson M.D.   On: 04/09/2018 13:47   Mr Brain Wo Contrast  Addendum Date: 04/09/2018   ADDENDUM REPORT: 04/09/2018 10:01 ADDENDUM: Study discussed by telephone with Dr. Kateri Plummer, on call for Dr. Blair Lambert on 04/09/2018 at 0952 hours. We agreed that the best course of action may be to have the patient seen in the nearby [facility] Emergency Department today to facilitate evaluation by Neurology. Electronically Signed   By: Odessa Fleming M.D.   On: 04/09/2018 10:01   Result Date: 04/09/2018 CLINICAL  DATA:  62 year old female with left side headaches and sporadic numbness in the right hand for 4-6 weeks. Prior stroke in 2011. EXAM: MRI HEAD WITHOUT CONTRAST TECHNIQUE: Multiplanar, multiecho pulse sequences of the brain and surrounding structures were obtained  without intravenous contrast. COMPARISON:  Brain MRI 09/01/2010. FINDINGS: Brain: Chronic right ACA territory ischemia and encephalomalacia with extension since the 2012 MRI, but no evidence of acute right hemisphere ischemia. There is associated chronic hemosiderin and mild ex vacuo enlargement of the right lateral ventricle. Patchy restricted diffusion in the left lateral occiput (series 3, image 72) corresponding to the left MCA/PCA watershed. Additionally, there are scattered small patchy areas of left frontal and parietal cortical and corona radiata abnormality on trace diffusion (series 3, image 87) which appears isointense on ADC. T2 and FLAIR hyperintensity in these areas compatible with cytotoxic edema and/or developing encephalomalacia. No hemorrhage or mass effect. No other diffusion abnormality. Signal remains normal in the bilateral deep gray matter nuclei, brainstem, and cerebellum. No other chronic cerebral blood products identified. No midline shift, mass effect, evidence of mass lesion, ventriculomegaly, extra-axial collection or acute intracranial hemorrhage. Cervicomedullary junction and pituitary are within normal limits. Vascular: Major intracranial vascular flow voids are stable since 2012. Skull and upper cervical spine: Negative visible cervical spine. Visualized bone marrow signal is within normal limits. Sinuses/Orbits: Negative orbit soft tissues. Moderate chronic paranasal sinus mucosal thickening has not significantly changed since 2012. Other: Visible internal auditory structures appear normal. Mastoids remain clear. Scalp and face soft tissues appear negative. IMPRESSION: 1. Small areas of mixed acute and subacute ischemia in  the left hemisphere. These are primarily affecting the watershed areas of the Left MCA/ACA and Left MCA/PCA. The most acute area is in the left lateral occiput. No associated hemorrhage or mass effect. 2. Chronic but progressed right ACA territory ischemia since the 2012 MRI. Associated chronic hemosiderin, encephalomalacia, and ex vacuo enlargement of the right lateral ventricle. Electronically Signed: By: Odessa Fleming M.D. On: 04/09/2018 09:50   Dg Chest Port 1 View  Result Date: 04/10/2018 CLINICAL DATA:  62 year old female with chest pain. EXAM: PORTABLE CHEST 1 VIEW COMPARISON:  None. FINDINGS: The heart size and mediastinal contours are within normal limits. Both lungs are clear. The visualized skeletal structures are unremarkable. IMPRESSION: No active disease. Electronically Signed   By: Elgie Collard M.D.   On: 04/10/2018 02:52       Subjective: No chest pain or sob. No nausea or vomiting.   Discharge Exam: Vitals:   04/10/18 0734 04/10/18 1703  BP: 140/64 130/61  Pulse: (!) 55 63  Resp: 18 18  Temp: 98.2 F (36.8 C) (!) 97.4 F (36.3 C)  SpO2: 97% 100%   Vitals:   04/10/18 0211 04/10/18 0407 04/10/18 0734 04/10/18 1703  BP: (!) 149/58 140/66 140/64 130/61  Pulse: 60 (!) 48 (!) 55 63  Resp: 18 18 18 18   Temp: 97.8 F (36.6 C) 97.9 F (36.6 C) 98.2 F (36.8 C) (!) 97.4 F (36.3 C)  TempSrc: Oral Oral Oral Oral  SpO2: 99% 98% 97% 100%  Weight:      Height:        General: Pt is alert, awake, not in acute distress Cardiovascular: RRR, S1/S2 +, no rubs, no gallops Respiratory: CTA bilaterally, no wheezing, no rhonchi Abdominal: Soft, NT, ND, bowel sounds + Extremities: no edema, no cyanosis    The results of significant diagnostics from this hospitalization (including imaging, microbiology, ancillary and laboratory) are listed below for reference.     Microbiology: No results found for this or any previous visit (from the past 240 hour(s)).   Labs: BNP (last  3 results) No results for input(s): BNP in the last 8760 hours. Basic Metabolic Panel:  Recent Labs  Lab 04/09/18 1045  NA 140  K 4.4  CL 108  CO2 27  GLUCOSE 106*  BUN 12  CREATININE 0.77  CALCIUM 9.4   Liver Function Tests: Recent Labs  Lab 04/09/18 1045  AST 23  ALT 20  ALKPHOS 63  BILITOT 0.7  PROT 6.5  ALBUMIN 3.7   No results for input(s): LIPASE, AMYLASE in the last 168 hours. No results for input(s): AMMONIA in the last 168 hours. CBC: Recent Labs  Lab 04/09/18 1045  WBC 4.4  NEUTROABS 1.5*  HGB 12.9  HCT 40.7  MCV 92.9  PLT 180   Cardiac Enzymes: Recent Labs  Lab 04/10/18 0226  TROPONINI <0.03   BNP: Invalid input(s): POCBNP CBG: Recent Labs  Lab 04/09/18 1300  GLUCAP 89   D-Dimer No results for input(s): DDIMER in the last 72 hours. Hgb A1c Recent Labs    04/10/18 0226  HGBA1C 5.0   Lipid Profile Recent Labs    04/10/18 0226  CHOL 241*  HDL 75  LDLCALC 155*  TRIG 54  CHOLHDL 3.2   Thyroid function studies Recent Labs    04/10/18 1636  TSH 0.854   Anemia work up No results for input(s): VITAMINB12, FOLATE, FERRITIN, TIBC, IRON, RETICCTPCT in the last 72 hours. Urinalysis    Component Value Date/Time   COLORURINE YELLOW 01/24/2010 1145   APPEARANCEUR CLEAR 01/24/2010 1145   LABSPEC 1.009 01/24/2010 1145   PHURINE 7.0 01/24/2010 1145   GLUCOSEU NEGATIVE 01/24/2010 1145   HGBUR NEGATIVE 01/24/2010 1145   BILIRUBINUR NEGATIVE 01/24/2010 1145   KETONESUR NEGATIVE 01/24/2010 1145   PROTEINUR NEGATIVE 01/24/2010 1145   UROBILINOGEN 0.2 01/24/2010 1145   NITRITE NEGATIVE 01/24/2010 1145   LEUKOCYTESUR  01/24/2010 1145    NEGATIVE MICROSCOPIC NOT DONE ON URINES WITH NEGATIVE PROTEIN, BLOOD, LEUKOCYTES, NITRITE, OR GLUCOSE <1000 mg/dL.   Sepsis Labs Invalid input(s): PROCALCITONIN,  WBC,  LACTICIDVEN Microbiology No results found for this or any previous visit (from the past 240 hour(s)).   Time coordinating  discharge: 37 minutes  SIGNED:   Kathlen Mody, MD  [facility] 04/12/2018, 11:44 PM Pager   If 7PM-7AM, please contact night-coverage [email or web address] Password [email or web address]

## 2018-04-14 ENCOUNTER — Ambulatory Visit: Payer: BC Managed Care – PPO

## 2018-04-25 ENCOUNTER — Ambulatory Visit (INDEPENDENT_AMBULATORY_CARE_PROVIDER_SITE_OTHER): Payer: BC Managed Care – PPO | Admitting: *Deleted

## 2018-04-25 DIAGNOSIS — I639 Cerebral infarction, unspecified: Secondary | ICD-10-CM

## 2018-04-25 LAB — CUP PACEART INCLINIC DEVICE CHECK
Implantable Pulse Generator Implant Date: 20191028
MDC IDC SESS DTM: 20191112122813

## 2018-04-25 NOTE — Progress Notes (Signed)
Loop wound check in clinic. Steri-strips removed, incision edges approximated, wound well healed. Battery status: good. R-waves 1.2852mV. No episodes. Patient educated about wound care and Carelink monitoring. Monthly summary reports and ROV with GT PRN.

## 2018-05-15 ENCOUNTER — Ambulatory Visit (INDEPENDENT_AMBULATORY_CARE_PROVIDER_SITE_OTHER): Payer: BC Managed Care – PPO

## 2018-05-15 DIAGNOSIS — I639 Cerebral infarction, unspecified: Secondary | ICD-10-CM

## 2018-05-15 NOTE — Progress Notes (Signed)
Carelink Summary Report / Loop Recorder 

## 2018-05-17 ENCOUNTER — Ambulatory Visit (INDEPENDENT_AMBULATORY_CARE_PROVIDER_SITE_OTHER): Payer: BC Managed Care – PPO | Admitting: Neurology

## 2018-05-17 ENCOUNTER — Encounter: Payer: Self-pay | Admitting: Neurology

## 2018-05-17 VITALS — BP 135/80 | HR 71 | Ht 67.0 in | Wt 138.0 lb

## 2018-05-17 DIAGNOSIS — E7849 Other hyperlipidemia: Secondary | ICD-10-CM | POA: Diagnosis not present

## 2018-05-17 DIAGNOSIS — I639 Cerebral infarction, unspecified: Secondary | ICD-10-CM | POA: Diagnosis not present

## 2018-05-17 MED ORDER — EVOLOCUMAB 140 MG/ML ~~LOC~~ SOSY: 140.0000 mg | PREFILLED_SYRINGE | SUBCUTANEOUS | 3 refills | Status: DC

## 2018-05-17 NOTE — Patient Instructions (Signed)
I had a long d/w patient and her husband about her recent  Cryptogenic stroke, risk for recurrent stroke/TIAs, personally independently reviewed imaging studies and stroke evaluation results and answered questions.Continue aspirin 81 mg daily  for secondary stroke prevention and maintain strict control of hypertension with blood pressure goal below 130/90, diabetes with hemoglobin A1c goal below 6.5% and lipids with LDL cholesterol goal below 70 mg/dL. I also advised the patient to eat a healthy diet with plenty of whole grains, cereals, fruits and vegetables, exercise regularly and maintain ideal body weight .recommend Repatha 140 mg subcutaneously every other week for her statin intolerance and elevated lipids.  She has failed Lipitor and Crestor in the past due to myalgias.  Followup in the future with my nurse practitioner Shanda Bumps in 6 months or call earlier if necessary Evolocumab injection What is this medicine? EVOLOCUMAB (e voe LOK ue mab) is known as a PCSK9 inhibitor. It is used to lower the level of cholesterol in the blood. It may be used alone or in combination with other cholesterol-lowering drugs. This drug may also be used to reduce the risk of heart attack, stroke, and certain types of heart surgery in patients with heart disease. This medicine may be used for other purposes; ask your health care provider or pharmacist if you have questions. COMMON BRAND NAME(S): REPATHA What should I tell my health care provider before I take this medicine? They need to know if you have any of these conditions: -an unusual or allergic reaction to evolocumab, other medicines, foods, dyes, or preservatives -pregnant or trying to get pregnant -breast-feeding How should I use this medicine? This medicine is for injection under the skin. You will be taught how to prepare and give this medicine. Use exactly as directed. Take your medicine at regular intervals. Do not take your medicine more often than  directed. It is important that you put your used needles and syringes in a special sharps container. Do not put them in a trash can. If you do not have a sharps container, call your pharmacist or health care provider to get one. Talk to your pediatrician regarding the use of this medicine in children. While this drug may be prescribed for children as young as 13 years for selected conditions, precautions do apply. Overdosage: If you think you have taken too much of this medicine contact a poison control center or emergency room at once. NOTE: This medicine is only for you. Do not share this medicine with others. What if I miss a dose? If you miss a dose, take it as soon as you can if there are more than 7 days until the next scheduled dose, or skip the missed dose and take the next dose according to your original schedule. Do not take double or extra doses. What may interact with this medicine? Interactions are not expected. This list may not describe all possible interactions. Give your health care provider a list of all the medicines, herbs, non-prescription drugs, or dietary supplements you use. Also tell them if you smoke, drink alcohol, or use illegal drugs. Some items may interact with your medicine. What should I watch for while using this medicine? You may need blood work while you are taking this medicine. What side effects may I notice from receiving this medicine? Side effects that you should report to your doctor or health care professional as soon as possible: -allergic reactions like skin rash, itching or hives, swelling of the face, lips, or tongue -signs and  symptoms of infection like fever or chills; cough; sore throat; pain or trouble passing urine Side effects that usually do not require medical attention (report to your doctor or health care professional if they continue or are bothersome): -diarrhea -nausea -muscle pain -pain, redness, or irritation at site where  injected This list may not describe all possible side effects. Call your doctor for medical advice about side effects. You may report side effects to FDA at 1-800-FDA-1088. Where should I keep my medicine? Keep out of the reach of children. You will be instructed on how to store this medicine. Throw away any unused medicine after the expiration date on the label. NOTE: This sheet is a summary. It may not cover all possible information. If you have questions about this medicine, talk to your doctor, pharmacist, or health care provider.  2018 Elsevier/Gold Standard (2016-05-17 13:21:53)

## 2018-05-17 NOTE — Progress Notes (Signed)
[facility] [address] [place]. [place] [id number] [phone]       OFFICE FOLLOW-UP NOTE  Ms. Jamie Lambert Date of Birth:  Dec 26, 1955 Medical Record Number:  [id number]   HPI: Jamie Lambert is a pleasant 62 year old Caucasian lady seen today for initial office follow-up visit following hospital admission for strokes in October 2019.  She is accompanied by her husband.  History is obtained from them as well as review of electronic medical records.  I have personally reviewed imaging films.  She presented to [facility] on 04/09/2018 with headaches and right hand paresthesias for the couple of weeks prior to admission.  She saw her primary care physician who saw her and ordered an MRI scan of the brain which showed multiple small acute and subacute left hemispheric mostly watershed area MCA/ACA and MCA/PCA small infarcts.  There were chronic infarcts noted in the right hemispheric in the ACA territory as well.  CT angiogram of the head and neck showed no large vessel stenosis or occlusion.  There is mild noncalcified plaque in the left ICA bifurcation and proximal left ICA but without hemodynamically significant stenosis.  Transthoracic echo showed normal ejection fraction without cardiac source of embolism.  She previously had a transesophageal echocardiogram in 2011 which was negative for cardiac source of embolism hence it was not repeated.  LDL cholesterol was elevated at 155 mg percent.  Hemoglobin A1c was 5.0.  She was started on dual antiplatelet therapy aspirin and Plavix.  She subsequently had t loop recorder placed and so far paroxysmal A. fib has not yet been found.  She states she is done well since discharge.  She does have minor bruising but no major bleeding episodes.  She still has some residual numbness in the right hand but overall she is doing quite well.  She has previous history of intolerance to Lipitor and Crestor.  She is wondering if she needs  to start the new PC is 9 inhibitor injections.  She did discuss this with primary physician Dr. Manus GunningEhinger but he felt he could not prescribe it.  She was asked to consider possible participation in the [place] trial but she refused.  ROS:   14 system review of systems is positive for easy bruising, feeling cold, headache, numbness and all other systems negative PMH:  Past Medical History:  Diagnosis Date  . Allergy   . History of colonoscopy    adenomatous colon polyp (colonoscopy 11/2007)  . Hypercholesteremia   . Osteopenia   . Stroke (HCC)   . 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 Vitamin D deficiency     Social History:  Social History   Socioeconomic History  . Marital status: Married    Spouse name: Not on file  . Number of children: Not on file  . Years of education: Not on file  . Highest education level: Not on file  Occupational History  . Not on file  Social Needs  . Financial resource strain: Not on file  . Food insecurity:    Worry: Not on file    Inability: Not on file  . Transportation needs:    Medical: Not on file    Non-medical: Not on file  Tobacco Use  . Smoking status: Never Smoker  . Smokeless tobacco: Never Used  Substance and Sexual Activity  . Alcohol use: Yes    Alcohol/week: 1.0 standard drinks    Types: 1 Glasses of wine  per week    Comment: once a day  . Drug use: Not Currently  . Sexual activity: Not on file  Lifestyle  . Physical activity:    Days per week: Not on file    Minutes per session: Not on file  . Stress: Not on file  Relationships  . Social connections:    Talks on phone: Not on file    Gets together: Not on file    Attends religious service: Not on file    Active member of club or organization: Not on file    Attends meetings of clubs or organizations: Not on file    Relationship status: Not on file  . Intimate partner violence:    Fear of current or ex partner: Not on file    Emotionally abused: Not on  file    Physically abused: Not on file    Forced sexual activity: Not on file  Other Topics Concern  . Not on file  Social History Narrative  . Not on file    Medications:   Current Outpatient Medications on File Prior to Visit  Medication Sig Dispense Refill  . acetaminophen (TYLENOL) 325 MG tablet Take 325 mg by mouth every 6 (six) hours as needed for mild pain.     . Ascorbic Acid (VITAMIN C) 1000 MG tablet Take 1,000 mg by mouth daily.    Marland Kitchen aspirin EC 81 MG EC tablet Take 1 tablet (81 mg total) by mouth daily. 30 tablet 1  . Cholecalciferol (VITAMIN D-3 PO) Take by mouth.    . Multiple Vitamin (MULTIVITAMIN) capsule Take 1 capsule by mouth daily.    . Omega-3 Fatty Acids (OMEGA-3 FISH OIL PO) Take by mouth.    Marland Kitchen UNABLE TO FIND Total balance unisex two in am and two in pm     No current facility-administered medications on file prior to visit.     Allergies:   Allergies  Allergen Reactions  . Prednisone Hives    Physical Exam General: well developed, well nourished middle-aged Caucasian lady, seated, in no evident distress Head: head normocephalic and atraumatic.  Neck: supple with no carotid or supraclavicular bruits Cardiovascular: regular rate and rhythm, no murmurs Musculoskeletal: no deformity Skin:  no rash/petichiae Vascular:  Normal pulses all extremities Vitals:   05/17/18 1404  BP: 135/80  Pulse: 71   Neurologic Exam Mental Status: Awake and fully alert. Oriented to place and time. Recent and remote memory intact. Attention span, concentration and fund of knowledge appropriate. Mood and affect appropriate.  Cranial Nerves: Fundoscopic exam reveals sharp disc margins. Pupils equal, briskly reactive to light. Extraocular movements full without nystagmus. Visual fields full to confrontation. Hearing intact. Facial sensation intact. Face, tongue, palate moves normally and symmetrically.  Motor: Normal bulk and tone. Normal strength in all tested extremity  muscles. Sensory.: intact to touch ,pinprick .position and vibratory sensation.  Coordination: Rapid alternating movements normal in all extremities. Finger-to-nose and heel-to-shin performed accurately bilaterally. Gait and Station: Arises from chair without difficulty. Stance is normal. Gait demonstrates normal stride length and balance . Able to heel, toe and tandem walk with moderate difficulty.  Reflexes: 1+ and symmetric. Toes downgoing.   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  Remote history of cryptogenic right MCA infarcts in 2011.  Vascular risk factors of hyperlipidemia only.     PLAN: I had a long d/w patient and her  husband about her recent  Cryptogenic stroke, risk for recurrent stroke/TIAs, personally independently reviewed imaging studies and stroke evaluation results and answered questions.Continue aspirin 81 mg daily  for secondary stroke prevention and maintain strict control of hypertension with blood pressure goal below 130/90, diabetes with hemoglobin A1c goal below 6.5% and lipids with LDL cholesterol goal below 70 mg/dL. I also advised the patient to eat a healthy diet with plenty of whole grains, cereals, fruits and vegetables, exercise regularly and maintain ideal body weight .recommend Repatha 140 mg subcutaneously every other week for her statin intolerance and elevated lipids.  She has failed Lipitor and Crestor in the past due to myalgias.  Followup in the future with my nurse practitioner Shanda Bumps in 6 months or call earlier if necessary Greater than 50% of time during this 25 minute visit was spent on counseling,explanation of diagnosis of cryptogenic stroke, planning of further management, discussion with patient and family and coordination of care Delia Heady, MD  [facility] [address] [place], [place] [address]  Phone  [phone] Fax [phone] Note: This document was prepared with digital dictation and possible smart phrase technology. Any transcriptional errors that result from this process are unintentional

## 2018-05-24 ENCOUNTER — Ambulatory Visit
Admission: RE | Admit: 2018-05-24 | Discharge: 2018-05-24 | Disposition: A | Discharge status: Home / Self Care | Payer: BC Managed Care – PPO | Source: Ambulatory Visit | Attending: Family Medicine | Admitting: Family Medicine

## 2018-05-24 DIAGNOSIS — Z1231 Encounter for screening mammogram for malignant neoplasm of breast: Secondary | ICD-10-CM

## 2018-05-29 ENCOUNTER — Encounter: Payer: Self-pay | Admitting: Neurology

## 2018-05-29 ENCOUNTER — Telehealth: Payer: Self-pay

## 2018-05-29 ENCOUNTER — Other Ambulatory Visit: Payer: Self-pay

## 2018-05-29 MED ORDER — EVOLOCUMAB 140 MG/ML ~~LOC~~ SOAJ: 140.0000 mg | SUBCUTANEOUS | 3 refills | Status: DC

## 2018-05-29 NOTE — Telephone Encounter (Signed)
RN tried to initiate PA on cover my meds. RN was unable to do it on cover my meds. Rn receive help form the help desk. Rn requested a hard copy fax to [phone]. RN was told a fax of paper PA will be sent to our office today or tomorrow.

## 2018-05-29 NOTE — Telephone Encounter (Signed)
Rn call [facility] at [facility]. RN stated pt was told by pharmacy it was not cover under her plan. RN stated no fax of not cover or PA was sent to our office. Phil pharmacist stated he is not sure what happen. Rn stated did they have another medication who insurance can cover. Phil stated no other medication is recommend at this time.

## 2018-05-29 NOTE — Telephone Encounter (Signed)
PA will be done on cover my meds and paper if necessary.Rn waiting for clinical questions from insurance.

## 2018-05-30 NOTE — Telephone Encounter (Signed)
Co pay left at front desk for patient.

## 2018-05-30 NOTE — Telephone Encounter (Signed)
PA approve from 05/30/2018 to 05/30/2019. [id number].Marland Kitchen. Contact numbers  [phone], or [phone].

## 2018-05-30 NOTE — Telephone Encounter (Addendum)
Rn call patient that her medication was approve for a year. PT stated she heard it was a coupon that she can use. Rn stated their is a coupon only if she has straight Nurse, learning disabilitycommercial insurance. PT stated to leave coupon at front desk for pick up. Pt ask will I show how to do the injection. Rn stated our office can show her a one time nurse visit for injection training when she gets the medication or ask the pharmacy. Rn reminded pt that our office is closed on christmas eve and christmas day. Rn stated notice is needed if she needs to come in for the injection to give a day notice to be on the schedule. Rn also stated there there is a 1844 repatha line she can cal for assistance.PT verbalized understanding. [place]ittany CMA was present and listen during this conversation.

## 2018-06-05 NOTE — Progress Notes (Signed)
NEUROLOGY CONSULTATION NOTE  Jamie Lambert MRN: [id number] DOB: 12/17/1955  Referring provider: Blair Heys, MD Primary care provider: Blair Heys, MD  Reason for consult:  stroke  HISTORY OF PRESENT ILLNESS: Jamie Lambert is a 62 year old right-handed female with history of stroke in 2011 due to vascular spasm who presents for recent stroke.  History supplemented by ED and prior neurologist notes.  Stopped ASA in February 2018.  In early October 2019, she began experiencing intermittent headaches with numbness and tingling in her right hand.  She had an MRI of the brain on 04/09/2018 which was personally reviewed and revealed an acute and subacute strokes in the left hemisphere involving the left MCA/ACA watershed region.  She was instructed by her PCP to go to the ER immediately.  She was admitted to [facility] where she underwent a stroke work-up.  CTA of the head and neck was personally reviewed and revealed noncalcified plaque in the left ICA but no emergent intracranial or extracranial large vessel occlusion.  2D echocardiogram showed a LV EF 60% to 65%.  LDL was 155.  Hemoglobin A1c was 5.  As stroke work-up in 2011 revealed negative TEE and 30-day cardiac event monitor, and implantable loop recorder was inserted.  She was discharged on aspirin 81 mg daily and Plavix 75 mg daily.  She has had prior statin intolerance, she was started on Repatha.  She still has intermittent numbness in the finger tips of the last two digits of the right hand.    PAST MEDICAL HISTORY: Past Medical History:  Diagnosis Date  . Allergy   . History of colonoscopy    adenomatous colon polyp (colonoscopy 11/2007)  . Hypercholesteremia   . Osteopenia   . Stroke (HCC)   . 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 Vitamin D deficiency     PAST SURGICAL HISTORY: Past Surgical History:  Procedure Laterality Date  . LOOP RECORDER INSERTION N/A 04/10/2018   Procedure: LOOP RECORDER INSERTION;  Surgeon: Marinus Maw, MD;  Location: [facility] INVASIVE CV LAB;  Service: Cardiovascular;  Laterality: N/A;    MEDICATIONS: Current Outpatient Medications on File Prior to Visit  Medication Sig Dispense Refill  . acetaminophen (TYLENOL) 325 MG tablet Take 325 mg by mouth every 6 (six) hours as needed for mild pain.     . Ascorbic Acid (VITAMIN C) 1000 MG tablet Take 1,000 mg by mouth daily.    Marland Kitchen aspirin EC 81 MG EC tablet Take 1 tablet (81 mg total) by mouth daily. 30 tablet 1  . Cholecalciferol (VITAMIN D-3 PO) Take by mouth.    . Evolocumab (REPATHA SURECLICK) 140 MG/ML SOAJ Inject 140 mg into the skin every 14 (fourteen) days. 2 mL 3  . Multiple Vitamin (MULTIVITAMIN) capsule Take 1 capsule by mouth daily.    . Omega-3 Fatty Acids (OMEGA-3 FISH OIL PO) Take by mouth.    Marland Kitchen UNABLE TO FIND Total balance unisex two in am and two in pm     No current facility-administered medications on file prior to visit.     ALLERGIES: Allergies  Allergen Reactions  . Prednisone Hives    FAMILY HISTORY: Family History  Problem Relation Age of Onset  . Hypertension Mother   . Dementia Mother   . COPD Father   . Alcoholism Brother   . Stroke Maternal Grandmother     SOCIAL HISTORY: Social History   Socioeconomic History  . Marital status:  Married    Spouse name: Not on file  . Number of children: Not on file  . Years of education: Not on file  . Highest education level: Not on file  Occupational History  . Not on file  Social Needs  . Financial resource strain: Not on file  . Food insecurity:    Worry: Not on file    Inability: Not on file  . Transportation needs:    Medical: Not on file    Non-medical: Not on file  Tobacco Use  . Smoking status: Never Smoker  . Smokeless tobacco: Never Used  Substance and Sexual Activity  . Alcohol use: Yes    Alcohol/week: 1.0 standard drinks    Types: 1 Glasses of wine per week    Comment: once a day  .  Drug use: Not Currently  . Sexual activity: Not on file  Lifestyle  . Physical activity:    Days per week: Not on file    Minutes per session: Not on file  . Stress: Not on file  Relationships  . Social connections:    Talks on phone: Not on file    Gets together: Not on file    Attends religious service: Not on file    Active member of club or organization: Not on file    Attends meetings of clubs or organizations: Not on file    Relationship status: Not on file  . Intimate partner violence:    Fear of current or ex partner: Not on file    Emotionally abused: Not on file    Physically abused: Not on file    Forced sexual activity: Not on file  Other Topics Concern  . Not on file  Social History Narrative  . Not on file    REVIEW OF SYSTEMS: Constitutional: No fevers, chills, or sweats, no generalized fatigue, change in appetite Eyes: No visual changes, double vision, eye pain Ear, nose and throat: No hearing loss, ear pain, nasal congestion, sore throat Cardiovascular: No chest pain, palpitations Respiratory:  No shortness of breath at rest or with exertion, wheezes GastrointestinaI: No nausea, vomiting, diarrhea, abdominal pain, fecal incontinence Genitourinary:  No dysuria, urinary retention or frequency Musculoskeletal:  No neck pain, back pain Integumentary: No rash, pruritus, skin lesions Neurological: as above Psychiatric: No depression, insomnia, anxiety Endocrine: No palpitations, fatigue, diaphoresis, mood swings, change in appetite, change in weight, increased thirst Hematologic/Lymphatic:  No purpura, petechiae. Allergic/Immunologic: no itchy/runny eyes, nasal congestion, recent allergic reactions, rashes  PHYSICAL EXAM: Blood pressure 136/72, height 5\' 7"  (1.702 m), weight 140 lb (63.5 kg). General: No acute distress.  Patient appears well-groomed.   Head:  Normocephalic/atraumatic Eyes:  fundi examined but not visualized Neck: supple, no paraspinal  tenderness, full range of motion Back: No paraspinal tenderness Heart: regular rate and rhythm Lungs: Clear to auscultation bilaterally. Vascular: No carotid bruits. Neurological Exam: Mental status: alert and oriented to person, place, and time, recent and remote memory intact, fund of knowledge intact, attention and concentration intact, speech fluent and not dysarthric, language intact. Cranial nerves: CN I: not tested CN II: pupils equal, round and reactive to light, visual fields intact CN III, IV, VI:  full range of motion, no nystagmus, no ptosis CN V: facial sensation intact CN VII: upper and lower face symmetric CN VIII: hearing intact CN IX, X: gag intact, uvula midline CN XI: sternocleidomastoid and trapezius muscles intact CN XII: tongue midline Bulk & Tone: normal, no fasciculations. Motor:  5/5 throughout  Sensation:  Hypersensitivity to the 5th digit of right hand to pinprick; vibration sensation intact.   Deep Tendon Reflexes:  2+ throughout, toes downgoing.   Finger to nose testing:  Without dysmetria.   Heel to shin:  Without dysmetria.   Gait:  Normal station and stride.  Able to turn and tandem walk. Romberg negative.  IMPRESSION: 1.  Left hemispheric infarcts in ACA/MCA watershed distribution, embolic from unknown source.  PLAN: 1.  She is now on ASA 81mg  daily alone for secondary stroke prevention. 2.  She will continue Repatha.  I will refill prescription from here. 3.  With implantable loop recorder to evaluate for cardiac source of stroke 4.  Continue routine walking for now. 5.  Mediterranean diet 6.  Follow up in 4 months.  Thank you for allowing me to take part in the care of this patient.  Shon MilletAdam , DO  CC: Blair Heysobert Ehinger, MD

## 2018-06-06 ENCOUNTER — Encounter: Payer: Self-pay | Admitting: Neurology

## 2018-06-06 ENCOUNTER — Ambulatory Visit (INDEPENDENT_AMBULATORY_CARE_PROVIDER_SITE_OTHER): Payer: BC Managed Care – PPO | Admitting: Neurology

## 2018-06-06 ENCOUNTER — Other Ambulatory Visit: Payer: Self-pay

## 2018-06-06 VITALS — BP 136/72 | Ht 67.0 in | Wt 140.0 lb

## 2018-06-06 DIAGNOSIS — I639 Cerebral infarction, unspecified: Secondary | ICD-10-CM

## 2018-06-06 NOTE — Patient Instructions (Signed)
1.  Continue aspirin 81mg  daily 2.  Continue Repatha.  I will refill it. 3.  Continue routine walking for now 4.  Mediterranean diet (see below) 5.  Follow up in 4 months.   Mediterranean Diet A Mediterranean diet refers to food and lifestyle choices that are based on the traditions of countries located on the [place]. This way of eating has been shown to help prevent certain conditions and improve outcomes for people who have chronic diseases, like kidney disease and heart disease. What are tips for following this plan? Lifestyle  Cook and eat meals together with your family, when possible.  Drink enough fluid to keep your urine clear or pale yellow.  Be physically active every day. This includes: ? Aerobic exercise like running or swimming. ? Leisure activities like gardening, walking, or housework.  Get 7-8 hours of sleep each night.  If recommended by your health care provider, drink red wine in moderation. This means 1 glass a day for nonpregnant women and 2 glasses a day for men. A glass of wine equals 5 oz (150 mL). Reading food labels   Check the serving size of packaged foods. For foods such as rice and pasta, the serving size refers to the amount of cooked product, not dry.  Check the total fat in packaged foods. Avoid foods that have saturated fat or trans fats.  Check the ingredients list for added sugars, such as corn syrup. Shopping  At the grocery store, buy most of your food from the areas near the walls of the store. This includes: ? Fresh fruits and vegetables (produce). ? Grains, beans, nuts, and seeds. Some of these may be available in unpackaged forms or large amounts (in bulk). ? Fresh seafood. ? Poultry and eggs. ? Low-fat dairy products.  Buy whole ingredients instead of prepackaged foods.  Buy fresh fruits and vegetables in-season from local farmers markets.  Buy frozen fruits and vegetables in resealable bags.  If you do not have  access to quality fresh seafood, buy precooked frozen shrimp or canned fish, such as tuna, salmon, or sardines.  Buy small amounts of raw or cooked vegetables, salads, or olives from the deli or salad bar at your store.  Stock your pantry so you always have certain foods on hand, such as olive oil, canned tuna, canned tomatoes, rice, pasta, and beans. Cooking  Cook foods with extra-virgin olive oil instead of using butter or other vegetable oils.  Have meat as a side dish, and have vegetables or grains as your main dish. This means having meat in small portions or adding small amounts of meat to foods like pasta or stew.  Use beans or vegetables instead of meat in common dishes like chili or lasagna.  Experiment with different cooking methods. Try roasting or broiling vegetables instead of steaming or sauteing them.  Add frozen vegetables to soups, stews, pasta, or rice.  Add nuts or seeds for added healthy fat at each meal. You can add these to yogurt, salads, or vegetable dishes.  Marinate fish or vegetables using olive oil, lemon juice, garlic, and fresh herbs. Meal planning   Plan to eat 1 vegetarian meal one day each week. Try to work up to 2 vegetarian meals, if possible.  Eat seafood 2 or more times a week.  Have healthy snacks readily available, such as: ? Vegetable sticks with hummus. ? [place] yogurt. ? Fruit and nut trail mix.  Eat balanced meals throughout the week. This includes: ? Fruit:  2-3 servings a day ? Vegetables: 4-5 servings a day ? Low-fat dairy: 2 servings a day ? Fish, poultry, or lean meat: 1 serving a day ? Beans and legumes: 2 or more servings a week ? Nuts and seeds: 1-2 servings a day ? Whole grains: 6-8 servings a day ? Extra-virgin olive oil: 3-4 servings a day  Limit red meat and sweets to only a few servings a month What are my food choices?  Mediterranean diet ? Recommended ? Grains: Whole-grain pasta. Brown rice. Bulgar wheat. Polenta.  Couscous. Whole-wheat bread. [place]. ? Vegetables: Artichokes. Beets. Broccoli. Cabbage. Carrots. Eggplant. Green beans. Chard. Kale. Spinach. Onions. Leeks. Peas. Squash. Tomatoes. Peppers. Radishes. ? Fruits: Apples. Apricots. Avocado. Berries. Bananas. Cherries. Dates. Figs. Grapes. Lemons. Melon. Oranges. Peaches. Plums. Pomegranate. ? Meats and other protein foods: Beans. Almonds. Sunflower seeds. Pine nuts. Peanuts. [place]. Salmon. Scallops. Shrimp. [place]. Tilapia. Clams. Oysters. Eggs. ? Dairy: Low-fat milk. Cheese. Greek yogurt. ? Beverages: Water. Red wine. Herbal tea. ? Fats and oils: Extra virgin olive oil. Avocado oil. Grape seed oil. ? Sweets and desserts: [place] yogurt with honey. Baked apples. Poached pears. Trail mix. ? Seasoning and other foods: Basil. Cilantro. Coriander. Cumin. Mint. Parsley. Sage. Rosemary. Tarragon. Garlic. Oregano. Thyme. Pepper. Balsalmic vinegar. Tahini. Hummus. Tomato sauce. Olives. Mushrooms. ? Limit these ? Grains: Prepackaged pasta or rice dishes. Prepackaged cereal with added sugar. ? Vegetables: Deep fried potatoes (french fries). ? Fruits: Fruit canned in syrup. ? Meats and other protein foods: Beef. Pork. Lamb. Poultry with skin. Hot dogs. [place] Salines. ? Dairy: Ice cream. Sour cream. Whole milk. ? Beverages: Juice. Sugar-sweetened soft drinks. Beer. Liquor and spirits. ? Fats and oils: Butter. [place] oil. Vegetable oil. Beef fat (tallow). Lard. ? Sweets and desserts: Cookies. Cakes. Pies. Candy. ? Seasoning and other foods: Mayonnaise. Premade sauces and marinades. ? The items listed may not be a complete list. Talk with your dietitian about what dietary choices are right for you. Summary  The Mediterranean diet includes both food and lifestyle choices.  Eat a variety of fresh fruits and vegetables, beans, nuts, seeds, and whole grains.  Limit the amount of red meat and sweets that you eat.  Talk with your health care provider about whether  it is safe for you to drink red wine in moderation. This means 1 glass a day for nonpregnant women and 2 glasses a day for men. A glass of wine equals 5 oz (150 mL). This information is not intended to replace advice given to you by your health care provider. Make sure you discuss any questions you have with your health care provider. Document Released: 01/22/2016 Document Revised: 02/24/2016 Document Reviewed: 01/22/2016 Elsevier Interactive Patient Education  2019 [employer].

## 2018-06-08 ENCOUNTER — Telehealth: Payer: Self-pay | Admitting: Neurology

## 2018-06-08 NOTE — Telephone Encounter (Signed)
Pt requesting a call to discuss refills for Evolocumab (REPATHA SURECLICK) 140 MG/ML SOAJ

## 2018-06-08 NOTE — Telephone Encounter (Signed)
Left message requesting a return call.

## 2018-06-09 LAB — CUP PACEART REMOTE DEVICE CHECK
Date Time Interrogation Session: 20191130190832
MDC IDC PG IMPLANT DT: 20191028

## 2018-06-09 MED ORDER — EVOLOCUMAB 140 MG/ML ~~LOC~~ SOAJ: 140.0000 mg | SUBCUTANEOUS | 3 refills | Status: DC

## 2018-06-09 NOTE — Telephone Encounter (Signed)
Returned call to patient.  She needs her Repatha prescription sent to [facility].  Her insurance will not allow continued refills at the local pharmacy.  This request has been completed for her.

## 2018-06-15 ENCOUNTER — Other Ambulatory Visit: Payer: Self-pay

## 2018-06-15 ENCOUNTER — Telehealth: Payer: Self-pay

## 2018-06-15 ENCOUNTER — Ambulatory Visit (INDEPENDENT_AMBULATORY_CARE_PROVIDER_SITE_OTHER): Payer: BC Managed Care – PPO

## 2018-06-15 DIAGNOSIS — I639 Cerebral infarction, unspecified: Secondary | ICD-10-CM

## 2018-06-15 LAB — CUP PACEART REMOTE DEVICE CHECK
Date Time Interrogation Session: 20200102155555
MDC IDC PG IMPLANT DT: 20191028

## 2018-06-15 MED ORDER — EVOLOCUMAB 140 MG/ML ~~LOC~~ SOAJ: 140.0000 mg | SUBCUTANEOUS | 3 refills | Status: DC

## 2018-06-15 NOTE — Telephone Encounter (Signed)
RN receive fax [facility] not longer dispense repatha medication after 06/13/2018. ITs requested that medication needs to be sent back to pts retail pharmacy. [facility] [address]. [facility] at [phone] ext [phone]. Rn verified with customer rep that repatha needs to be sent back to pts customer retail pharmacy. [facility] [address]. Customer rep stated pts and pharmacy were all sent letters of this change.

## 2018-06-16 NOTE — Progress Notes (Signed)
Carelink Summary Report / Loop Recorder 

## 2018-06-23 ENCOUNTER — Other Ambulatory Visit: Payer: Self-pay | Admitting: Neurology

## 2018-06-23 MED ORDER — EVOLOCUMAB 140 MG/ML ~~LOC~~ SOAJ: 140.0000 mg | SUBCUTANEOUS | 3 refills | Status: DC

## 2018-06-23 NOTE — Telephone Encounter (Signed)
Pt is in [place] and is needing a refill on her Evolocumab (REPATHA SURECLICK) 140 MG/ML SOAJ but [facility] [phone] Please advise.

## 2018-06-23 NOTE — Telephone Encounter (Signed)
[facility] [place] [place] [address] [phone]. Patient is aware and verbalized understanding and appreciation.

## 2018-07-18 ENCOUNTER — Ambulatory Visit (INDEPENDENT_AMBULATORY_CARE_PROVIDER_SITE_OTHER): Payer: BC Managed Care – PPO

## 2018-07-18 DIAGNOSIS — I639 Cerebral infarction, unspecified: Secondary | ICD-10-CM | POA: Diagnosis not present

## 2018-07-21 LAB — CUP PACEART REMOTE DEVICE CHECK
Date Time Interrogation Session: 20200204194027
MDC IDC PG IMPLANT DT: 20191028

## 2018-07-26 NOTE — Progress Notes (Signed)
Carelink Summary Report / Loop Recorder 

## 2018-08-01 ENCOUNTER — Ambulatory Visit: Payer: BC Managed Care – PPO | Admitting: Neurology

## 2018-08-21 ENCOUNTER — Ambulatory Visit (INDEPENDENT_AMBULATORY_CARE_PROVIDER_SITE_OTHER): Payer: BC Managed Care – PPO | Admitting: *Deleted

## 2018-08-21 DIAGNOSIS — I639 Cerebral infarction, unspecified: Secondary | ICD-10-CM

## 2018-08-22 LAB — CUP PACEART REMOTE DEVICE CHECK
Date Time Interrogation Session: 20200308201014
MDC IDC PG IMPLANT DT: 20191028

## 2018-08-28 NOTE — Progress Notes (Signed)
Carelink Summary Report / Loop Recorder 

## 2018-09-22 ENCOUNTER — Ambulatory Visit (INDEPENDENT_AMBULATORY_CARE_PROVIDER_SITE_OTHER): Payer: BC Managed Care – PPO | Admitting: *Deleted

## 2018-09-22 ENCOUNTER — Other Ambulatory Visit: Payer: Self-pay

## 2018-09-22 DIAGNOSIS — I639 Cerebral infarction, unspecified: Secondary | ICD-10-CM | POA: Diagnosis not present

## 2018-09-23 LAB — CUP PACEART REMOTE DEVICE CHECK
Date Time Interrogation Session: 20200410203828
Implantable Pulse Generator Implant Date: 20191028

## 2018-09-29 NOTE — Progress Notes (Signed)
Carelink Summary Report / Loop Recorder 

## 2018-10-06 ENCOUNTER — Encounter: Payer: Self-pay | Admitting: Neurology

## 2018-10-06 ENCOUNTER — Ambulatory Visit: Payer: BC Managed Care – PPO | Admitting: Neurology

## 2018-10-10 NOTE — Progress Notes (Addendum)
Virtual Visit via Video Note The purpose of this virtual visit is to provide medical care while limiting exposure to the novel coronavirus.    Consent was obtained for video visit:  Yes.   Answered questions that patient had about telehealth interaction:  Yes.   I discussed the limitations, risks, security and privacy concerns of performing an evaluation and management service by telemedicine. I also discussed with the patient that there may be a patient responsible charge related to this service. The patient expressed understanding and agreed to proceed.  Pt location: Home Physician Location: office Name of referring provider:  Blair HeysEhinger, Robert, MD I connected with Jamie LambertMary Ellen Nakagawa at patients initiation/request on 10/11/2018 at  9:30 AM EDT by video enabled telemedicine application and verified that I am speaking with the correct person using two identifiers. 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  UPDATE: Current medications:  ASA 81mg  daily, Repatha  She is feeling well.  Occasionally she still notes numbness in the finger tips of her right hand.  She has seasonal allergies and her nose has been running.  She eats healthy (vegetables, chicken, little red meat) and walks 2 to 4 miles daily.  Lipid panel from 2 weeks ago revealed total cholesterol 205, HDL 78, TG 60 and LDL 116.  HISTORY: In early October 2019, she began experiencing intermittent headaches with numbness and tingling in her right hand.  She had an MRI of the brain on 04/09/2018 which was personally reviewed and revealed an acute and subacute strokes in the left hemisphere involving the left MCA/ACA watershed region.  She was instructed by her PCP to go to the ER immediately.  [facility] she underwent a stroke work-up.  CTA of the head and neck was personally reviewed and revealed noncalcified plaque in the left ICA but no emergent intracranial or extracranial large vessel occlusion.  2D echocardiogram showed a LV EF 60% to 65%.  LDL was 155.  Hemoglobin A1c was 5.  As stroke work-up in 2011 revealed negative TEE and 30-day cardiac event monitor, and implantable loop recorder was inserted.  She was discharged on aspirin 81 mg daily and Plavix 75 mg daily.  She has had prior statin intolerance, she was started on Repatha.  She still has intermittent numbness in the finger tips of the last two digits of the right hand.    Past Medical History: Past Medical History:  Diagnosis Date  . Allergy   . History of colonoscopy    adenomatous colon polyp (colonoscopy 11/2007)  . Hypercholesteremia   . Osteopenia   . Stroke (HCC)   . 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 Vitamin D deficiency     Medications: Outpatient Encounter Medications as of 10/11/2018  Medication Sig  . acetaminophen (TYLENOL) 325 MG tablet Take 325 mg by mouth every 6 (six) hours as needed for mild pain.   . Ascorbic Acid (VITAMIN C) 1000 MG tablet Take 1,000 mg by mouth daily.  Marland Kitchen. aspirin EC 81 MG EC tablet Take 1 tablet (81 mg total) by mouth daily.  . Cholecalciferol (VITAMIN D-3 PO) Take by mouth.  . Evolocumab (REPATHA SURECLICK) 140 MG/ML SOAJ Inject 140 mg into the skin every  14 (fourteen) days.  . Multiple Vitamin (MULTIVITAMIN) capsule Take 1 capsule by mouth daily.  . Omega-3 Fatty Acids (OMEGA-3 FISH OIL PO) Take by mouth.  Marland Kitchen UNABLE TO FIND Total balance unisex two in am and two in pm   No facility-administered encounter medications on file as of 10/11/2018.     Allergies: Allergies  Allergen Reactions  . Prednisone Hives    Family History: Family History  Problem Relation Age of Onset  . Hypertension Mother   . Dementia Mother   . COPD Father   . Alcoholism  Brother   . Stroke Maternal Grandmother     Social History: Social History   Socioeconomic History  . Marital status: Married    Spouse name: Not on file  . Number of children: Not on file  . Years of education: Not on file  . Highest education level: Not on file  Occupational History  . Not on file  Social Needs  . Financial resource strain: Not on file  . Food insecurity:    Worry: Not on file    Inability: Not on file  . Transportation needs:    Medical: Not on file    Non-medical: Not on file  Tobacco Use  . Smoking status: Never Smoker  . Smokeless tobacco: Never Used  Substance and Sexual Activity  . Alcohol use: Yes    Alcohol/week: 1.0 standard drinks    Types: 1 Glasses of wine per week    Comment: once a day  . Drug use: Not Currently  . Sexual activity: Not on file  Lifestyle  . Physical activity:    Days per week: Not on file    Minutes per session: Not on file  . Stress: Not on file  Relationships  . Social connections:    Talks on phone: Not on file    Gets together: Not on file    Attends religious service: Not on file    Active member of club or organization: Not on file    Attends meetings of clubs or organizations: Not on file    Relationship status: Not on file  . Intimate partner violence:    Fear of current or ex partner: Not on file    Emotionally abused: Not on file    Physically abused: Not on file    Forced sexual activity: Not on file  Other Topics Concern  . 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 Alert and oriented.  Speech fluent and not dysarthric.  Language intact.  Eyes orthophoric on primary gaze and move in all directions.  Face symmetric.    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  Aspirin 81 mg daily for secondary stroke prevention 2.  Repatha 3.  Continue routine walking 4.  Mediterranean diet 5.  Continue monitoring of implantable loop recorder to evaluate for arrhythmia 6.  Follow-up in 6 months.  Follow Up Instructions:    -I discussed the assessment and treatment plan with the patient. The patient was provided an opportunity to ask questions and all were answered. The patient agreed with the plan and demonstrated an understanding of the instructions.   The patient was  advised to call back or seek an in-person evaluation if the symptoms worsen or if the condition fails to improve as anticipated.    Total Time spent in visit with the patient was:  11 minutes  Cira Servant, DO

## 2018-10-11 ENCOUNTER — Encounter: Payer: Self-pay | Admitting: Neurology

## 2018-10-11 ENCOUNTER — Telehealth (INDEPENDENT_AMBULATORY_CARE_PROVIDER_SITE_OTHER): Payer: BC Managed Care – PPO | Admitting: Neurology

## 2018-10-11 DIAGNOSIS — I639 Cerebral infarction, unspecified: Secondary | ICD-10-CM

## 2018-10-19 ENCOUNTER — Telehealth: Payer: Self-pay

## 2018-10-19 NOTE — Telephone Encounter (Signed)
Spoke with the patient in regards to her upcoming appt with Shanda Bumps in June. The patient then stated that she is now seeing [facility] and will no longer need her appt with Shanda Bumps. She stated that she was going to call us and let us know. Appt has been cancelled.

## 2018-10-25 ENCOUNTER — Other Ambulatory Visit: Payer: Self-pay

## 2018-10-25 ENCOUNTER — Ambulatory Visit (INDEPENDENT_AMBULATORY_CARE_PROVIDER_SITE_OTHER): Payer: BC Managed Care – PPO | Admitting: *Deleted

## 2018-10-25 DIAGNOSIS — I639 Cerebral infarction, unspecified: Secondary | ICD-10-CM | POA: Diagnosis not present

## 2018-10-26 LAB — CUP PACEART REMOTE DEVICE CHECK
Date Time Interrogation Session: 20200513213853
Implantable Pulse Generator Implant Date: 20191028

## 2018-11-10 NOTE — Progress Notes (Signed)
Carelink Summary Report / Loop Recorder 

## 2018-11-16 ENCOUNTER — Ambulatory Visit: Payer: BC Managed Care – PPO | Admitting: Adult Health

## 2018-11-27 ENCOUNTER — Ambulatory Visit (INDEPENDENT_AMBULATORY_CARE_PROVIDER_SITE_OTHER): Payer: BC Managed Care – PPO | Admitting: *Deleted

## 2018-11-27 DIAGNOSIS — I639 Cerebral infarction, unspecified: Secondary | ICD-10-CM | POA: Diagnosis not present

## 2018-11-27 LAB — CUP PACEART REMOTE DEVICE CHECK
Date Time Interrogation Session: 20200615213716
Implantable Pulse Generator Implant Date: 20191028

## 2018-12-03 NOTE — Progress Notes (Signed)
Carelink Summary Report / Loop Recorder 

## 2018-12-31 LAB — CUP PACEART REMOTE DEVICE CHECK
Date Time Interrogation Session: 20200718213529
Implantable Pulse Generator Implant Date: 20191028

## 2019-01-01 ENCOUNTER — Ambulatory Visit (INDEPENDENT_AMBULATORY_CARE_PROVIDER_SITE_OTHER): Payer: BC Managed Care – PPO | Admitting: *Deleted

## 2019-01-01 DIAGNOSIS — I639 Cerebral infarction, unspecified: Secondary | ICD-10-CM

## 2019-01-15 NOTE — Progress Notes (Signed)
Carelink Summary Report / Loop Recorder 

## 2019-01-26 ENCOUNTER — Telehealth: Payer: Self-pay | Admitting: Neurology

## 2019-01-26 NOTE — Telephone Encounter (Signed)
Pt left a message by mychart on Tuesday and has not heard back from [place] and wanted to make sure we got the message it is about a medication question  Per her VM  Please call

## 2019-01-26 NOTE — Telephone Encounter (Signed)
Called Pt, LMOVM, advisng Pt I did receive message and have forwarded it to Dr. Tomi Likens.

## 2019-02-01 ENCOUNTER — Ambulatory Visit (INDEPENDENT_AMBULATORY_CARE_PROVIDER_SITE_OTHER): Payer: BC Managed Care – PPO | Admitting: *Deleted

## 2019-02-01 DIAGNOSIS — I6300 Cerebral infarction due to thrombosis of unspecified precerebral artery: Secondary | ICD-10-CM | POA: Diagnosis not present

## 2019-02-04 LAB — CUP PACEART REMOTE DEVICE CHECK
Date Time Interrogation Session: 20200820224107
Implantable Pulse Generator Implant Date: 20191028

## 2019-02-08 NOTE — Progress Notes (Signed)
Carelink Summary Report / Loop Recorder 

## 2019-03-06 ENCOUNTER — Ambulatory Visit (INDEPENDENT_AMBULATORY_CARE_PROVIDER_SITE_OTHER): Payer: BC Managed Care – PPO | Admitting: *Deleted

## 2019-03-06 DIAGNOSIS — I6300 Cerebral infarction due to thrombosis of unspecified precerebral artery: Secondary | ICD-10-CM

## 2019-03-06 LAB — CUP PACEART REMOTE DEVICE CHECK
Date Time Interrogation Session: 20200922230539
Implantable Pulse Generator Implant Date: 20191028

## 2019-03-13 NOTE — Progress Notes (Signed)
Carelink Summary Report / Loop Recorder 

## 2019-04-09 ENCOUNTER — Ambulatory Visit (INDEPENDENT_AMBULATORY_CARE_PROVIDER_SITE_OTHER): Payer: BC Managed Care – PPO | Admitting: Family Medicine

## 2019-04-09 ENCOUNTER — Ambulatory Visit (INDEPENDENT_AMBULATORY_CARE_PROVIDER_SITE_OTHER): Payer: BC Managed Care – PPO | Admitting: *Deleted

## 2019-04-09 ENCOUNTER — Other Ambulatory Visit: Payer: Self-pay

## 2019-04-09 VITALS — BP 126/68 | Ht 67.0 in | Wt 130.0 lb

## 2019-04-09 DIAGNOSIS — M79672 Pain in left foot: Secondary | ICD-10-CM

## 2019-04-09 DIAGNOSIS — M79671 Pain in right foot: Secondary | ICD-10-CM | POA: Diagnosis not present

## 2019-04-09 DIAGNOSIS — I6300 Cerebral infarction due to thrombosis of unspecified precerebral artery: Secondary | ICD-10-CM | POA: Diagnosis not present

## 2019-04-09 LAB — CUP PACEART REMOTE DEVICE CHECK
Date Time Interrogation Session: 20201025234023
Implantable Pulse Generator Implant Date: 20191028

## 2019-04-09 NOTE — Progress Notes (Signed)
     Subjective: HPI: Jamie Lambert is a 63 y.o. presenting to clinic today to discuss the following:  Bilateral Foot Follow Up Patient is here for custom orthotics. She had some made about 1.5 years ago and she has done "very well" with them. She no longer runs but does walk 3 miles per day in them. She has no complaints today, no foot or ankle pain. She is tolerating her walks well and requests additional orthotics made for her today.  Objective: BP 126/68   Ht 5\' 7"  (1.702 m)   Wt 130 lb (59 kg)   BMI 20.36 kg/m  Vitals and nursing notes reviewed  Physical Exam Ankle/Foot, Right: No visible erythema, swelling, ecchymosis, or bony deformity. Mild notable pes planus deformity. Transverse arch grossly intact; Mild tibiotalar deviation in pronation; Range of motion is full in all directions. Strength is 5/5 in all directions. No tenderness at the insertion/body/myotendinous junction of the Achilles tendon; No peroneal tendon tenderness or subluxation; No tenderness on posterior aspects of lateral and medial malleolus; Stable lateral and medial ligaments; Talar dome nontender; Unremarkable calcaneal squeeze; No plantar calcaneal tenderness; No tenderness over the navicular prominence; No tenderness over cuboid; No pain at base of 5th MT; No tenderness at the distal metatarsals; Able to walk 4 steps.   Ankle/Foot, Left: No visible erythema, swelling, ecchymosis, or bony deformity. Mild notable pes planus deformity. Transverse arch grossly intact; Mild tibiotalar deviation in pronation; Range of motion is full in all directions. Strength is 5/5 in all directions. No tenderness at the insertion/body/myotendinous junction of the Achilles tendon; No peroneal tendon tenderness or subluxation; No tenderness on posterior aspects of lateral and medial malleolus; Stable lateral and medial ligaments; Talar dome nontender; Unremarkable calcaneal squeeze; No plantar calcaneal tenderness; No tenderness  over the navicular prominence; No tenderness over cuboid; No pain at base of 5th MT; No tenderness at the distal metatarsals; Able to walk 4 steps.   Assessment/Plan:  Patient was fitted for a : standard, cushioned, semi-rigid orthotic. The orthotic was heated and afterward the patient stood on the orthotic blank positioned on the orthotic stand. The patient was positioned in subtalar neutral position and 10 degrees of ankle dorsiflexion in a weight bearing stance. After completion of molding, a stable base was applied to the orthotic blank. The blank was ground to a stable position for weight bearing. Size: 7 red cambray Base: blue EVA Additional Posting and Padding: medial heel wedges The patient ambulated these, and they were very comfortable.  I spent 40 minutes with this patient, greater than 50% was face-to-face time counseling regarding the diagnosis, treatment options, pathophysiology and prognosis of the diagnoses listed below.  PATIENT EDUCATION PROVIDED: See AVS    Diagnosis and plan along with any newly prescribed medication(s) were discussed in detail with this patient today. The patient verbalized understanding and agreed with the plan. Patient advised if symptoms worsen return to clinic or ER.    Harolyn Rutherford, DO 04/09/2019, 2:31 PM PGY-3 [place]

## 2019-04-10 ENCOUNTER — Encounter: Payer: Self-pay | Admitting: Family Medicine

## 2019-04-18 NOTE — Progress Notes (Signed)
NEUROLOGY FOLLOW UP OFFICE NOTE  Jamie Lambert [id number]  HISTORY OF PRESENT ILLNESS: Jamie Lambert is a 63 year old right-handed woman with history of stroke in 2011 due to vascular spasm who follows up for stroke.  UPDATE: Current medications:  ASA 81mg  daily, fluvastatin (M, W, F)  She is feeling well.  sometimes, she says she easily feels cold.  Occasionally she still notes numbness in the finger tips of her right hand.  She eats healthy (vegetables, chicken, little red meat) and walks 2 to 4 miles daily.   HISTORY: In early October 2019, she began experiencing intermittent headaches with numbnessand tinglingin her right hand. She had an MRI of the brain on 04/09/2018 which was personally reviewed and revealed an acute and subacute strokes in the left hemisphere involving the left MCA/ACA watershed region. She was instructed by her PCP to go to the ER immediately. She was admitted to [facility] where she underwent a stroke work-up. CTA of the head and neck was personally reviewed and revealed noncalcified plaque in the left ICA but no emergent intracranial or extracranial large vessel occlusion. 2D echocardiogram showed a LV EF 60% to 65%. LDL was 155. Hemoglobin A1c was 5. As stroke work-up in 2011 revealed negative TEE and 30-day cardiac event monitor, and implantable loop recorder was inserted. She was discharged on aspirin 81 mg daily and Plavix 75 mg daily. She has had prior statin intolerance, she was started on Repatha which was discontinued due to flu-like symptoms.  She still has intermittent numbness in the finger tips of the last two digits of the right hand.  PAST MEDICAL HISTORY: Past Medical History:  Diagnosis Date  . Allergy   . History of colonoscopy    adenomatous colon polyp (colonoscopy 11/2007)  . Hypercholesteremia   . Osteopenia   . Stroke (HCC)   . 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 Vitamin D  deficiency     MEDICATIONS: Current Outpatient Medications on File Prior to Visit  Medication Sig Dispense Refill  . acetaminophen (TYLENOL) 325 MG tablet Take 325 mg by mouth every 6 (six) hours as needed for mild pain.     . Ascorbic Acid (VITAMIN C) 1000 MG tablet Take 1,000 mg by mouth daily.    02/2010 aspirin EC 81 MG EC tablet Take 1 tablet (81 mg total) by mouth daily. 30 tablet 1  . Cholecalciferol (VITAMIN D-3 PO) Take by mouth.    . Evolocumab (REPATHA SURECLICK) 140 MG/ML SOAJ Inject 140 mg into the skin every 14 (fourteen) days. (Patient not taking: Reported on 04/09/2019) 2 mL 3  . fluvastatin (LESCOL) 40 MG capsule TAKE 1 CAPSULE MONDAYS, WEDNESDAYS, AND FRIDAYS FOR CHOLESTEROL ORALLY    . loratadine (CLARITIN) 10 MG tablet Take 10 mg by mouth daily.    . Multiple Vitamin (MULTIVITAMIN) capsule Take 1 capsule by mouth daily.    . Omega-3 Fatty Acids (OMEGA-3 FISH OIL PO) Take by mouth.    04/11/2019 UNABLE TO FIND Total balance unisex two in am and two in pm     No current facility-administered medications on file prior to visit.     ALLERGIES: Allergies  Allergen Reactions  . Prednisone Hives    FAMILY HISTORY: Family History  Problem Relation Age of Onset  . Hypertension Mother   . Dementia Mother   . COPD Father   . Alcoholism Brother   . Stroke Maternal Grandmother    SOCIAL HISTORY:  Social History   Socioeconomic History  . Marital status: Married    Spouse name: Jonny RuizJohn  . Number of children: Not on file  . Years of education: Not on file  . Highest education level: Not on file  Occupational History  . Occupation: retired  Engineer, productionocial Needs  . Financial resource strain: Not on file  . Food insecurity    Worry: Not on file    Inability: Not on file  . Transportation needs    Medical: Not on file    Non-medical: Not on file  Tobacco Use  . Smoking status: Never Smoker  . Smokeless tobacco: Never Used  Substance and Sexual Activity  . Alcohol use: Yes     Alcohol/week: 1.0 standard drinks    Types: 1 Glasses of wine per week    Comment: once a day  . Drug use: Not Currently  . Sexual activity: Not on file  Lifestyle  . Physical activity    Days per week: Not on file    Minutes per session: Not on file  . Stress: Not on file  Relationships  . Social Musicianconnections    Talks on phone: Not on file    Gets together: Not on file    Attends religious service: Not on file    Active member of club or organization: Not on file    Attends meetings of clubs or organizations: Not on file    Relationship status: Not on file  . Intimate partner violence    Fear of current or ex partner: Not on file    Emotionally abused: Not on file    Physically abused: Not on file    Forced sexual activity: Not on file  Other Topics Concern  . 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   Recently retired - Proofreaderdevelopment officer for [employer]    REVIEW OF SYSTEMS: Constitutional: No fevers, chills, or sweats, no generalized fatigue, change in appetite Eyes: No visual changes, double vision, eye pain Ear, nose and throat: No hearing loss, ear pain, nasal congestion, sore throat Cardiovascular: No chest pain, palpitations Respiratory:  No shortness of breath at rest or with exertion, wheezes GastrointestinaI: No nausea, vomiting, diarrhea, abdominal pain, fecal incontinence Genitourinary:  No dysuria, urinary retention or frequency Musculoskeletal:  No neck pain, back pain Integumentary: No rash, pruritus, skin lesions Neurological: as above Psychiatric: No depression, insomnia, anxiety Endocrine: No palpitations, fatigue, diaphoresis, mood swings, change in appetite, change in weight, increased thirst Hematologic/Lymphatic:  No purpura, petechiae. Allergic/Immunologic: no itchy/runny eyes, nasal congestion, recent allergic reactions, rashes  PHYSICAL EXAM: Blood pressure 120/63,  pulse 63, height 5\' 7"  (1.702 m), weight 136 lb (61.7 kg), SpO2 100 %. General: No acute distress.  Patient appears well-groomed.   Head:  Normocephalic/atraumatic Eyes:  Fundi examined but not visualized Neck: supple, no paraspinal tenderness, full range of motion Heart:  Regular rate and rhythm Lungs:  Clear to auscultation bilaterally Back: No paraspinal tenderness Neurological Exam: alert and oriented to person, place, and time. Attention span and concentration intact, recent and remote memory intact, fund of knowledge intact.  Speech fluent and not dysarthric, language intact.  CN II-XII intact. Bulk and tone normal, muscle strength 5/5 throughout.  Sensation to light touch, temperature and vibration intact.  Deep tendon reflexes 2+ throughout, toes downgoing.  Finger to nose and heel to shin testing intact.  Gait normal, Romberg negative.  IMPRESSION: Left hemispheric infarcts in ACA/MCA watershed distribution, embolic from unknown source  PLAN: 1.  ASA 81mg  daily for secondary stroke prevention. 2.  Statin therapy (LDL goal less than 70) 3.  Continue routine walking 4.  Mediterranean diet 5.  Continue monitoring of implantable loop recorder to evaluate for arrhythmia 6.  Follow-up in one year  15 minutes spent face to face with patient, over 50% spent discussing management.   Metta Clines, DO  CC: Gaynelle Arabian, MD

## 2019-04-19 ENCOUNTER — Ambulatory Visit: Payer: BC Managed Care – PPO | Admitting: Neurology

## 2019-04-19 ENCOUNTER — Encounter: Payer: Self-pay | Admitting: Neurology

## 2019-04-19 ENCOUNTER — Other Ambulatory Visit: Payer: Self-pay

## 2019-04-19 VITALS — BP 120/63 | HR 63 | Ht 67.0 in | Wt 136.0 lb

## 2019-04-19 DIAGNOSIS — I639 Cerebral infarction, unspecified: Secondary | ICD-10-CM

## 2019-04-19 NOTE — Patient Instructions (Signed)
1.  Continue aspirin 81mg  daily 2.  Continue statin therapy as managed by Dr. Marisue Humble 3.  Limit use of alcohol to one drink a day 4.  Continue healthy diet and exercise 5.  Follow up in one year.

## 2019-04-26 NOTE — Progress Notes (Signed)
Carelink Summary Report / Loop Recorder 

## 2019-05-09 ENCOUNTER — Ambulatory Visit (INDEPENDENT_AMBULATORY_CARE_PROVIDER_SITE_OTHER): Payer: BC Managed Care – PPO | Admitting: *Deleted

## 2019-05-09 DIAGNOSIS — I6350 Cerebral infarction due to unspecified occlusion or stenosis of unspecified cerebral artery: Secondary | ICD-10-CM | POA: Diagnosis not present

## 2019-05-13 LAB — CUP PACEART REMOTE DEVICE CHECK
Date Time Interrogation Session: 20201127183954
Implantable Pulse Generator Implant Date: 20191028

## 2019-05-30 ENCOUNTER — Telehealth: Payer: Self-pay

## 2019-05-30 NOTE — Telephone Encounter (Signed)
I called both listed pharmacies to find out where to have them send the PA for [place]. [facility] not filled medication in over 6 months. I stated pt goes to [facility] neurology now for her care.

## 2019-06-11 ENCOUNTER — Ambulatory Visit (INDEPENDENT_AMBULATORY_CARE_PROVIDER_SITE_OTHER): Payer: BC Managed Care – PPO | Admitting: *Deleted

## 2019-06-11 DIAGNOSIS — I6300 Cerebral infarction due to thrombosis of unspecified precerebral artery: Secondary | ICD-10-CM

## 2019-06-12 LAB — CUP PACEART REMOTE DEVICE CHECK
Date Time Interrogation Session: 20201228172035
Implantable Pulse Generator Implant Date: 20191028

## 2019-07-03 ENCOUNTER — Telehealth: Payer: Self-pay

## 2019-07-03 NOTE — Telephone Encounter (Signed)
LMOVM for pt to stop sending manual transmissions. I left my direct office number in case the pt has any questions. 

## 2019-07-12 ENCOUNTER — Ambulatory Visit (INDEPENDENT_AMBULATORY_CARE_PROVIDER_SITE_OTHER): Payer: BC Managed Care – PPO | Admitting: *Deleted

## 2019-07-12 DIAGNOSIS — I6300 Cerebral infarction due to thrombosis of unspecified precerebral artery: Secondary | ICD-10-CM

## 2019-07-13 LAB — CUP PACEART REMOTE DEVICE CHECK
Date Time Interrogation Session: 20210128172326
Date Time Interrogation Session: 20210127000500
Implantable Pulse Generator Implant Date: 20191028
Implantable Pulse Generator Implant Date: 20191028

## 2019-08-13 ENCOUNTER — Ambulatory Visit (INDEPENDENT_AMBULATORY_CARE_PROVIDER_SITE_OTHER): Payer: BC Managed Care – PPO | Admitting: *Deleted

## 2019-08-13 DIAGNOSIS — I6300 Cerebral infarction due to thrombosis of unspecified precerebral artery: Secondary | ICD-10-CM | POA: Diagnosis not present

## 2019-08-13 LAB — CUP PACEART REMOTE DEVICE CHECK
Date Time Interrogation Session: 20210228235122
Implantable Pulse Generator Implant Date: 20191028

## 2019-08-13 NOTE — Progress Notes (Signed)
ILR Remote 

## 2019-08-20 ENCOUNTER — Telehealth: Payer: Self-pay | Admitting: Emergency Medicine

## 2019-08-20 NOTE — Telephone Encounter (Signed)
Patient called with question concerning transmission report visible in myChart. Transmission showed AF episode that was actually SR with ectopy. Explained to patient that the AF episode was a false episode and that she has had no events of AF to date.

## 2019-09-13 ENCOUNTER — Ambulatory Visit (INDEPENDENT_AMBULATORY_CARE_PROVIDER_SITE_OTHER): Payer: BC Managed Care – PPO | Admitting: *Deleted

## 2019-09-13 DIAGNOSIS — I6300 Cerebral infarction due to thrombosis of unspecified precerebral artery: Secondary | ICD-10-CM

## 2019-09-13 LAB — CUP PACEART REMOTE DEVICE CHECK
Date Time Interrogation Session: 20210401005426
Implantable Pulse Generator Implant Date: 20191028

## 2019-09-13 NOTE — Progress Notes (Signed)
ILR Remote 

## 2019-10-14 LAB — CUP PACEART REMOTE DEVICE CHECK
Date Time Interrogation Session: 20210502010427
Implantable Pulse Generator Implant Date: 20191028

## 2019-10-15 ENCOUNTER — Ambulatory Visit (INDEPENDENT_AMBULATORY_CARE_PROVIDER_SITE_OTHER): Payer: BC Managed Care – PPO | Admitting: *Deleted

## 2019-10-15 DIAGNOSIS — I6300 Cerebral infarction due to thrombosis of unspecified precerebral artery: Secondary | ICD-10-CM

## 2019-10-15 NOTE — Progress Notes (Signed)
Carelink Summary Report / Loop Recorder 

## 2019-11-14 LAB — CUP PACEART REMOTE DEVICE CHECK
Date Time Interrogation Session: 20210602011747
Implantable Pulse Generator Implant Date: 20191028

## 2019-11-15 ENCOUNTER — Ambulatory Visit (INDEPENDENT_AMBULATORY_CARE_PROVIDER_SITE_OTHER): Payer: BC Managed Care – PPO | Admitting: *Deleted

## 2019-11-15 DIAGNOSIS — I6300 Cerebral infarction due to thrombosis of unspecified precerebral artery: Secondary | ICD-10-CM | POA: Diagnosis not present

## 2019-11-20 NOTE — Progress Notes (Signed)
Carelink Summary Report / Loop Recorder 

## 2019-11-29 ENCOUNTER — Ambulatory Visit: Payer: BC Managed Care – PPO | Admitting: Sports Medicine

## 2019-11-29 ENCOUNTER — Other Ambulatory Visit: Payer: Self-pay

## 2019-11-29 DIAGNOSIS — R269 Unspecified abnormalities of gait and mobility: Secondary | ICD-10-CM

## 2019-11-29 NOTE — Progress Notes (Signed)
  Jamie Lambert - 64 y.o. female MRN [id number]  Date of birth: [birth year]  SUBJECTIVE:   CC: orthotics  64 yo female presenting for new orthotics. She last had new orthotics in October 2020 and is getting new ones before she moved to [place].   DATA REVIEWED: Prior records  PHYSICAL EXAM:  VS: BP:126/70  HR: bpm  TEMP: ( )  RESP:   HT:5\' 7"  (170.2 cm)   WT:132 lb (59.9 kg)  BMI:20.67 PHYSICAL EXAM: Gen: NAD, alert, cooperative with exam, well-appearing HEENT: clear conjunctiva,  CV:  no edema, capillary refill brisk, normal rate Resp: non-labored Skin: no rashes, normal turgor  Neuro: no gross deficits.  Psych:  alert and oriented  Bilateral Feet: Inspection:  Pes planus, calcaneal valgus bilaterally with navicular collapse (L>R) Palpation: No tenderness to palpation ROM: Full  ROM of the ankle. Normal midfoot flexibility Strength: 5/5 strength ankle in all planes Neurovascular: N/V intact distally in the lower extremity   ASSESSMENT & PLAN:  Patient was fitted for a : standard, cushioned, semi-rigid orthotic. The orthotic was heated and afterward the patient stood on the orthotic blank positioned on the orthotic stand. The patient was positioned in subtalar neutral position and 10 degrees of ankle dorsiflexion in a weight bearing stance. After completion of molding, a stable base was applied to the orthotic blank. The blank was ground to a stable position for weight bearing. Size: 7  Base: blue EVA Posting/padding: medial heel wedges Ambulated in these and felt comfortable  Total time spent with the patient was 30 minutes with greater than 50% of the time spent in face-to-face consultation discussing orthotic construction, instruction, and sitting. Gait was neutral with orthotics in place. Patient found them to be comfortable. Follow-up as needed.  Patient seen and evaluated with the sports medicine fellow. I agree with the above plan of care. Custom orthotics  were created for this patient today. She found them to be comfortable prior to leaving the office. Gait was neutral with orthotics in place. Follow-up as needed.

## 2019-12-08 IMAGING — MR MR HEAD W/O CM
10 series · 45 of 48 positions shown · non-contrast
Comparison: Brain MRI 09/01/2010.

ADDENDUM:
Study discussed by telephone with Dr. Ern, on call for Dr. FLORE
MESERET on 04/09/2018 at 6166 hours.

We agreed that the best course of action may be to have the patient
seen in the nearby [REDACTED] today to
facilitate evaluation by Neurology.
CLINICAL DATA: 62-year-old female with left side headaches and
sporadic numbness in the right hand for 4-6 weeks. Prior stroke in
1533.
EXAM:
MRI HEAD WITHOUT CONTRAST
TECHNIQUE: Multiplanar, multiecho pulse sequences of the brain and surrounding
structures were obtained without intravenous contrast.

[Series 2: T1 · sagittal · 5.0mm · 0.45mm/px · 2 of 21 slices shown]
[im 1/21]
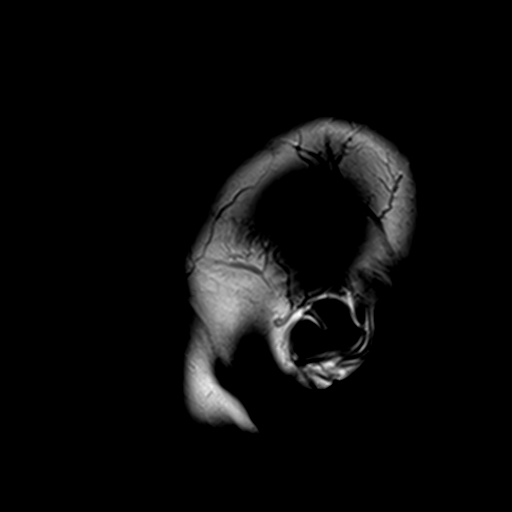
[im 21/21]
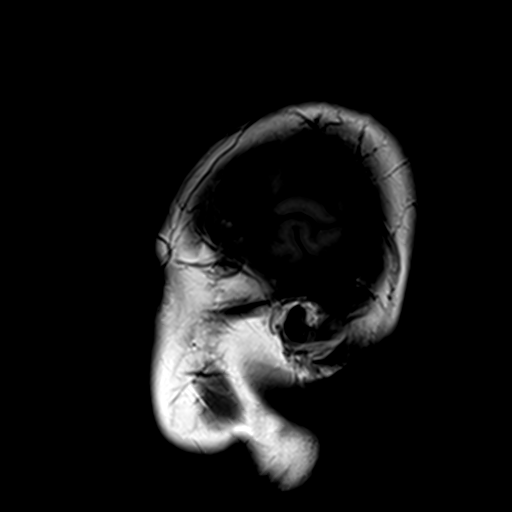

[Series 3: DWI · axial · 3.0mm · 1.80mm/px · z∈[-45,+102]mm · 8 of 100 slices shown (1 of 4)]
[im 1/100]
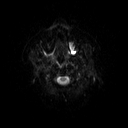
[im 15/100]
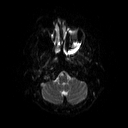
[im 29/100]
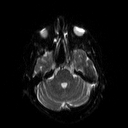
[im 43/100]
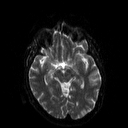
[im 57/100]
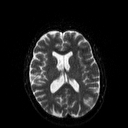
[im 71/100]
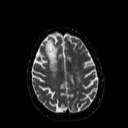
[im 85/100]
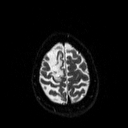
[im 100/100]
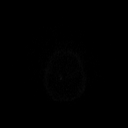

[Series 4: DWI · axial · 3.0mm · 1.80mm/px · z∈[-45,+102]mm · 4 of 50 slices shown (2 of 4)]
[im 1/50]
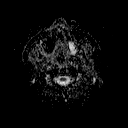
[im 17/50]
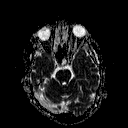
[im 33/50]
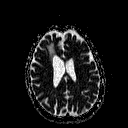
[im 50/50]
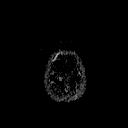

[Series 6: swi_images · axial · 2.0mm · 0.90mm/px · z∈[-50,+108]mm · 7 of 80 slices shown]
[im 1/80]
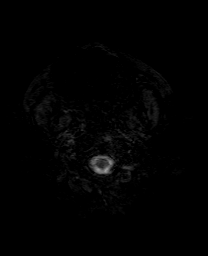
[im 14/80]
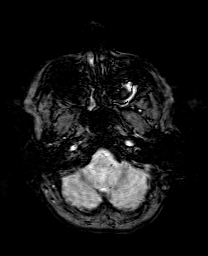
[im 27/80]
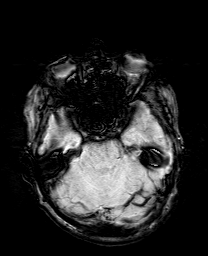
[im 40/80]
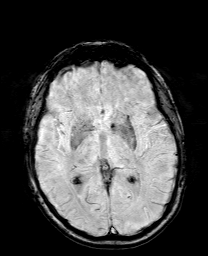
[im 53/80]
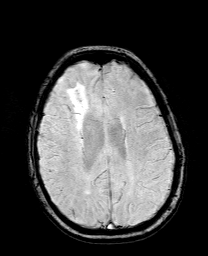
[im 66/80]
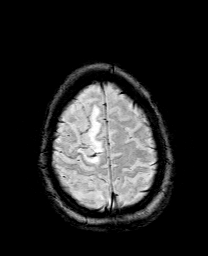
[im 80/80]
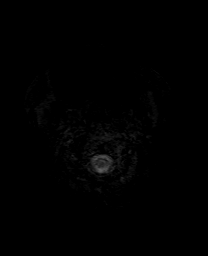

[Series 7: DWI · coronal · 5.0mm · 1.80mm/px · 5 of 67 slices shown (3 of 4)]
[im 1/67]
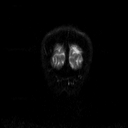
[im 17/67]
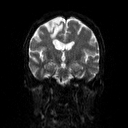
[im 34/67]
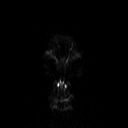
[im 50/67]
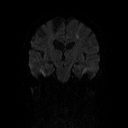
[im 67/67]
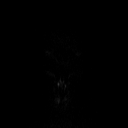

[Series 8: DWI · coronal · 5.0mm · 1.80mm/px · 3 of 34 slices shown (4 of 4)]
[im 1/34]
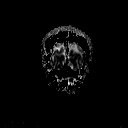
[im 17/34]
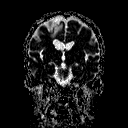
[im 34/34]
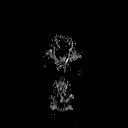

[Series 9: T2 · axial · 5.0mm · 0.51mm/px · z∈[-42,+100]mm · 2 of 22 slices shown (1 of 2)]
[im 1/22]
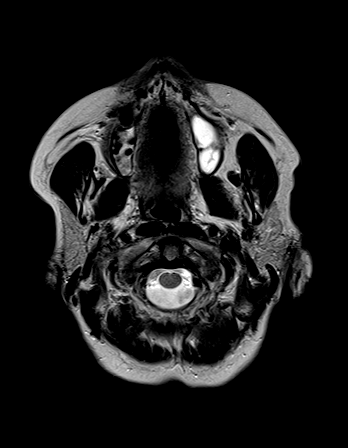
[im 22/22]
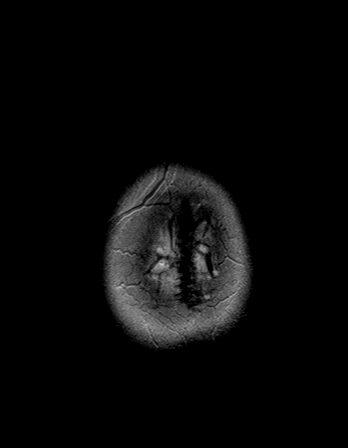

[Series 10: FLAIR · axial · 3.0mm · 0.45mm/px · z∈[-49,+107]mm · 2 of 27 slices shown]
[im 1/27]
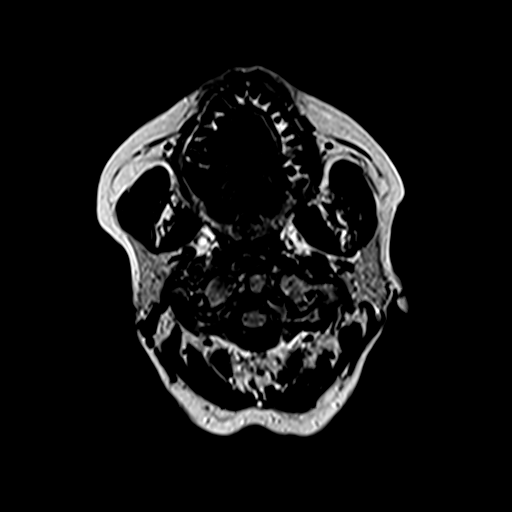
[im 27/27]
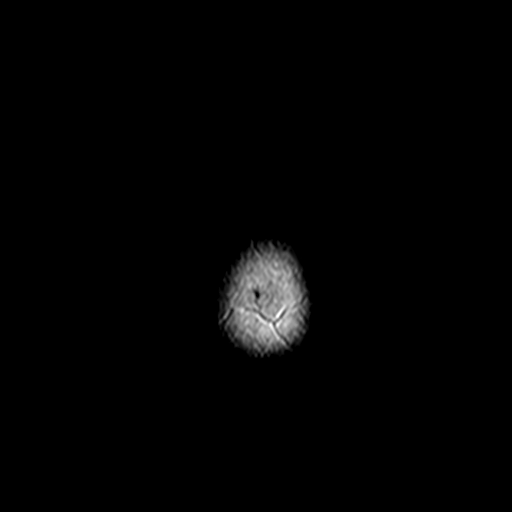

[Series 11: t1_mpr_tra copy center · axial · 1.0mm · 0.45mm/px · z∈[-50,+109]mm · 10 of 160 slices shown]
[im 1/160]
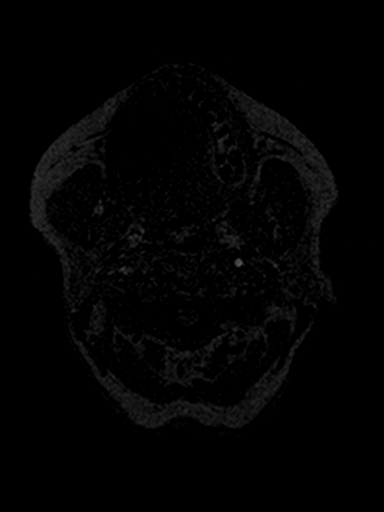
[im 14/160]
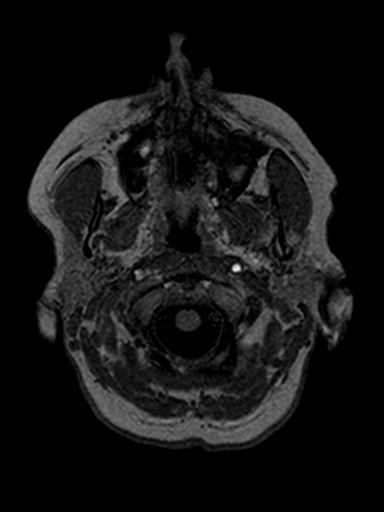
[im 27/160]
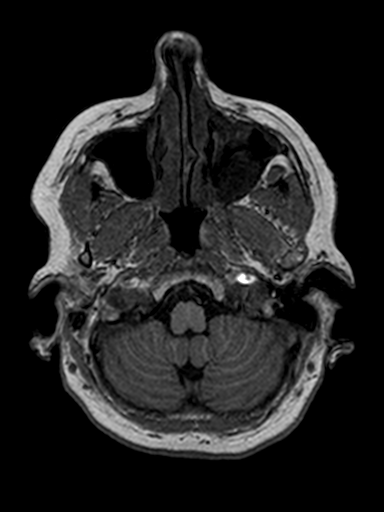
[im 40/160]
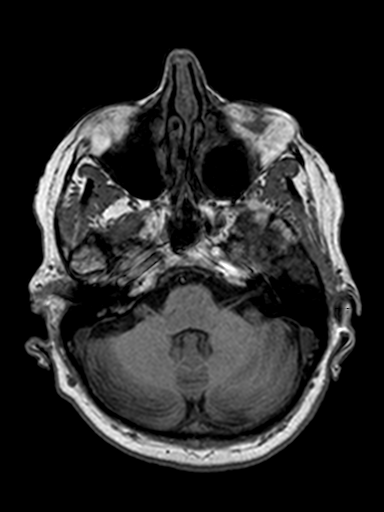
[im 54/160]
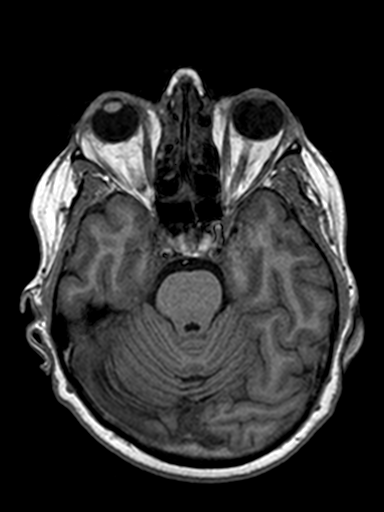
[im 67/160]
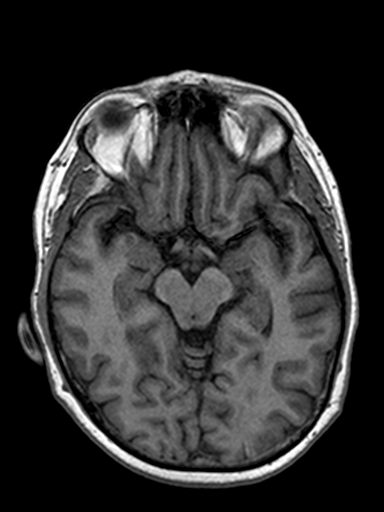
[im 93/160]
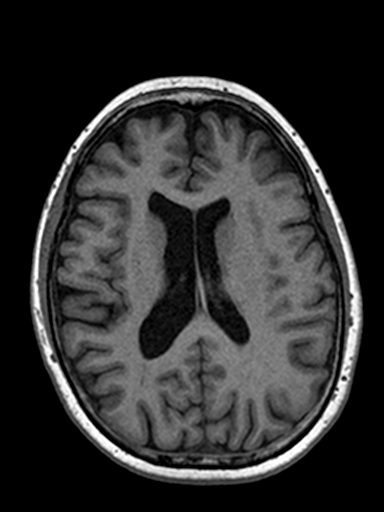
[im 107/160]
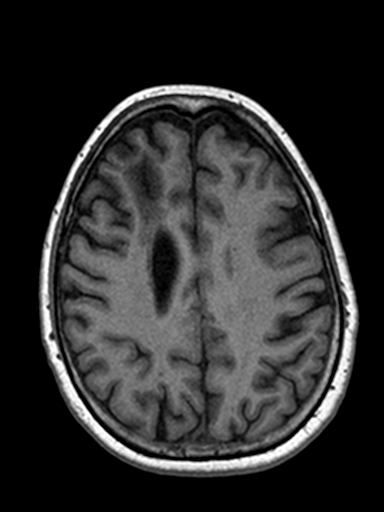
[im 133/160]
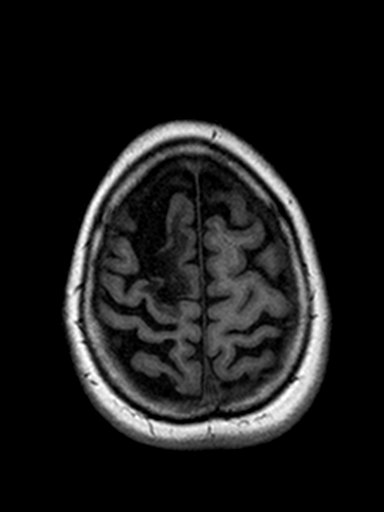
[im 160/160]
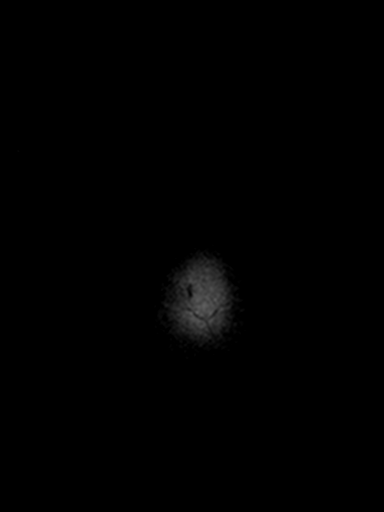

[Series 12: T2 · coronal · 5.0mm · 0.45mm/px · 2 of 26 slices shown (2 of 2)]
[im 1/26]
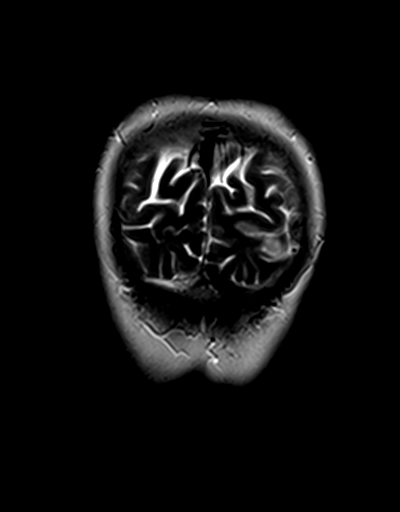
[im 26/26]
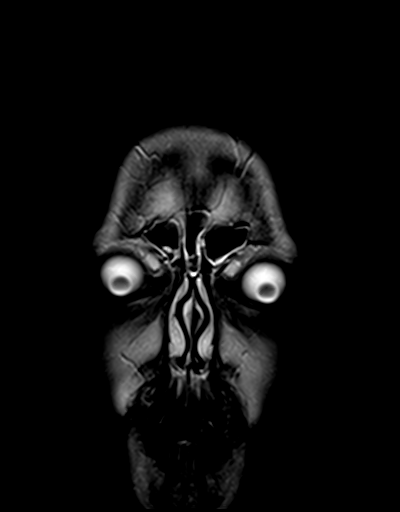

[45 of 48 positions shown; findings below may reference images not displayed]

FINDINGS: Brain: Chronic right ACA territory ischemia and encephalomalacia
with extension since the 1751 MRI, but no evidence of acute right
hemisphere ischemia. There is associated chronic hemosiderin and
mild ex vacuo enlargement of the right lateral ventricle.

Patchy restricted diffusion in the left lateral occiput (series 3,
image 72) corresponding to the left MCA/PCA watershed. Additionally,
there are scattered small patchy areas of left frontal and parietal
cortical and corona radiata abnormality on trace diffusion (series
3, image 87) which appears isointense on ADC.

T2 and FLAIR hyperintensity in these areas compatible with cytotoxic
edema and/or developing encephalomalacia. No hemorrhage or mass
effect.

No other diffusion abnormality. Signal remains normal in the
bilateral deep gray matter nuclei, brainstem, and cerebellum. No
other chronic cerebral blood products identified. No midline shift,
mass effect, evidence of mass lesion, ventriculomegaly, extra-axial
collection or acute intracranial hemorrhage. Cervicomedullary
junction and pituitary are within normal limits.

Vascular: Major intracranial vascular flow voids are stable since
1751.

Skull and upper cervical spine: Negative visible cervical spine.
Visualized bone marrow signal is within normal limits.

Sinuses/Orbits: Negative orbit soft tissues. Moderate chronic
paranasal sinus mucosal thickening has not significantly changed
since 1751.

Other: Visible internal auditory structures appear normal. Mastoids
remain clear. Scalp and face soft tissues appear negative.
IMPRESSION: 1. Small areas of mixed acute and subacute ischemia in the left
hemisphere.
These are primarily affecting the watershed areas of the Left
MCA/ACA and Left MCA/PCA.
The most acute area is in the left lateral occiput.
No associated hemorrhage or mass effect.
2. Chronic but progressed right ACA territory ischemia since the
1751 MRI. Associated chronic hemosiderin, encephalomalacia, and ex
vacuo enlargement of the right lateral ventricle.

## 2019-12-18 ENCOUNTER — Ambulatory Visit (INDEPENDENT_AMBULATORY_CARE_PROVIDER_SITE_OTHER): Payer: BC Managed Care – PPO | Admitting: *Deleted

## 2019-12-18 DIAGNOSIS — I6300 Cerebral infarction due to thrombosis of unspecified precerebral artery: Secondary | ICD-10-CM

## 2019-12-18 LAB — CUP PACEART REMOTE DEVICE CHECK
Date Time Interrogation Session: 20210706015500
Implantable Pulse Generator Implant Date: 20191028

## 2019-12-19 NOTE — Progress Notes (Signed)
Carelink Summary Report / Loop Recorder 

## 2020-01-19 LAB — CUP PACEART REMOTE DEVICE CHECK
Date Time Interrogation Session: 20210806015342
Implantable Pulse Generator Implant Date: 20191028

## 2020-01-21 ENCOUNTER — Ambulatory Visit (INDEPENDENT_AMBULATORY_CARE_PROVIDER_SITE_OTHER): Payer: BC Managed Care – PPO | Admitting: *Deleted

## 2020-01-21 DIAGNOSIS — I6300 Cerebral infarction due to thrombosis of unspecified precerebral artery: Secondary | ICD-10-CM | POA: Diagnosis not present

## 2020-01-21 NOTE — Progress Notes (Signed)
Carelink Summary Report / Loop Recorder 

## 2020-02-13 ENCOUNTER — Other Ambulatory Visit: Payer: Self-pay

## 2020-02-13 ENCOUNTER — Encounter: Payer: Self-pay | Admitting: Family Medicine

## 2020-02-13 ENCOUNTER — Ambulatory Visit: Payer: BC Managed Care – PPO | Admitting: Family Medicine

## 2020-02-13 VITALS — BP 104/68 | Ht 67.0 in | Wt 135.0 lb

## 2020-02-13 DIAGNOSIS — M25562 Pain in left knee: Secondary | ICD-10-CM | POA: Diagnosis not present

## 2020-02-13 DIAGNOSIS — M23204 Derangement of unspecified medial meniscus due to old tear or injury, left knee: Secondary | ICD-10-CM

## 2020-02-13 NOTE — Progress Notes (Signed)
PCP: Blair Heys, MD  Subjective:   HPI: Patient is a 64 y.o. female here for evaluation of left knee pain.  She was seen here fairly recently in 11/2019, at that time she was fashioned a custom orthotic due to bilateral foot pain that she was experiencing at that time.  She reports that since then her foot pain has improved, however over the last few weeks she has had worsening left medial knee pain.  She reports that she has had intermittent knee pains in the past, however this seems more severe and is not going away.  She actually moved to [place] and saw an orthopedist there, who got x-rays and said that she has preserved joint space and no signs of arthritis.  She was told that she may have patellar tendinitis.  She presents now here for reevaluation.  Today, patient states that the pain is in the medial aspect of her knee.  She can reproduce the pain when she palpates her medial joint line, she also notes that the pain is worse with twisting motions.  Is also worse when she goes on long walks which she likes to do daily.  She has not found any relieving factors, she does take NSAIDs on occasion which are somewhat helpful.  She is wondering if her new shoes or orthotics may be contributing to her pain.  Past Medical History:  Diagnosis Date  . Allergy   . History of colonoscopy    adenomatous colon polyp (colonoscopy 11/2007)  . Hypercholesteremia   . Osteopenia   . Stroke (HCC)   . 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 Vitamin D deficiency     Current Outpatient Medications on File Prior to Visit  Medication Sig Dispense Refill  . Ascorbic Acid (VITAMIN C) 1000 MG tablet Take 1,000 mg by mouth daily.    Marland Kitchen aspirin EC 81 MG EC tablet Take 1 tablet (81 mg total) by mouth daily. 30 tablet 1  . Cholecalciferol (VITAMIN D-3 PO) Take by mouth.    . Coenzyme Q10 (COQ10) 100 MG CAPS     . fluvastatin (LESCOL) 40 MG capsule TAKE 1 CAPSULE MONDAYS, WEDNESDAYS, AND FRIDAYS FOR  CHOLESTEROL ORALLY    . Multiple Vitamin (MULTIVITAMIN) capsule Take 1 capsule by mouth daily.     No current facility-administered medications on file prior to visit.    Past Surgical History:  Procedure Laterality Date  . LOOP RECORDER INSERTION N/A 04/10/2018   Procedure: LOOP RECORDER INSERTION;  Surgeon: Marinus Maw, MD;  Location: [facility] INVASIVE CV LAB;  Service: Cardiovascular;  Laterality: N/A;    Allergies  Allergen Reactions  . Prednisone Hives    Social History   Socioeconomic History  . Marital status: Married    Spouse name: Jonny Ruiz  . Number of children: 0  . Years of education: Not on file  . Highest education level: Master's degree (e.g., MA, MS, MEng, MEd, MSW, MBA)  Occupational History  . Occupation: retired  Tobacco Use  . Smoking status: Never Smoker  . Smokeless tobacco: Never Used  Vaping Use  . Vaping Use: Never used  Substance and Sexual Activity  . Alcohol use: Yes    Alcohol/week: 1.0 standard drink    Types: 1 Glasses of wine per week    Comment: once a day  . Drug use: Not Currently  . Sexual activity: Not on file  Other Topics Concern  . 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 Difficulty of Paying Living Expenses: Not on file  Food Insecurity:   . Worried About Programme researcher, broadcasting/film/video in the Last Year: Not on file  . Ran Out of Food in the Last Year: Not on file  Transportation Needs:   . Lack of Transportation (Medical): Not on file  . Lack of Transportation (Non-Medical): Not on file  Physical Activity:   . Days of Exercise per Week: Not on file  . Minutes of Exercise per Session: Not on file  Stress:   . Feeling of Stress : Not on file  Social Connections:   . Frequency of Communication with Friends and Family: Not on  file  . Frequency of Social Gatherings with Friends and Family: Not on file  . Attends Religious Services: Not on file  . Active Member of Clubs or Organizations: Not on file  . Attends Banker Meetings: Not on file  . Marital Status: Not on file  Intimate Partner Violence:   . Fear of Current or Ex-Partner: Not on file  . Emotionally Abused: Not on file  . Physically Abused: Not on file  . Sexually Abused: Not on file    Family History  Problem Relation Age of Onset  . Hypertension Mother   . Dementia Mother   . COPD Father   . Alcoholism Brother   . Stroke Maternal Grandmother     BP 104/68   Ht 5\' 7"  (1.702 m)   Wt 135 lb (61.2 kg)   BMI 21.14 kg/m   Review of Systems: See HPI above.     Objective:  Physical Exam:  Gen: NAD, comfortable in exam room  Left knee  Inspection: Bilateral knees without evidence of erythema, ecchymosis, swelling, edema. No effusion present  Active ROM: Intact. 0-160d. Passive ROM: 3d passive hyperextension  Strength: 5/5 strength to resisted flexion/extension without pain  Patella: No patellar facet tenderness. No apprehension. No proximal or distal patellar tendon tenderness to palpation. No quad tendon tenderness to palpation.  Tibia: No tibial plateau, tibial tuberosity tenderness.   Joint line: + Medial joint line tenderness Popliteal: No popliteal tenderness to the insertional gastroc. No insertional biceps femoris, semimembranosis, semitendinosis tenderness.  McMurrays/Thessaly's: Positive for pain in the medial joint space Lachmans: Stable bilaterally with firm endpoint  Anterior/Posterior drawer: Stable bilaterally Varus/valgus stress at 0, 15d: Negative for pain, laxity  Gait: Patient's gait was assessed both with and without her current orthotics.  She does have more valgus deviation of her left knee and overpronation of her left foot when she is not wearing the orthotics.  This is corrected well with orthotic  placement.   Assessment & Plan:  1.  Left medial knee pain, suspect degenerative meniscus tear  Patient's finding consistent with degenerative left medial meniscus tear with medial joint line pain, pain with twisting, and positive McMurray/Thessaly's.  Given that she is not having any mechanical symptoms, surgery is not recommended at this time.  Recommended doing home PT exercises, avoiding deep flexion, and Tylenol/ibuprofen as needed for pain.  Discussed that if she does develop mechanical symptoms or if pain is worsening, would consider MRI for further evaluation and possible surgical planning.  Evaluation of her gait with orthotics does show  improvement in her alignment enterically should help offload the joint space.  Recommend continuing with orthotics with activity.  Follow-up as needed.

## 2020-02-13 NOTE — Patient Instructions (Signed)
Thank you for coming in to see [place] today! You have been diagnosed with a degenerative meniscus tear. Please see below to review our plan for today's visit:   1. Please continue to wear your orthotics in your shoes when walking/exercising. 2. Please start doing the exercises shown to you today in clinic. 3. Please follow up with [place] if your pain is not improving, worsening, or if you have locking or catching in your knee.   Please call the clinic at [phone] if your symptoms worsen or you have any concerns. It was our pleasure to serve you.       Dr. Guy Sandifer Dr. Norton Blizzard [facility]

## 2020-02-20 LAB — CUP PACEART REMOTE DEVICE CHECK
Date Time Interrogation Session: 20210906015513
Implantable Pulse Generator Implant Date: 20191028

## 2020-02-25 ENCOUNTER — Ambulatory Visit (INDEPENDENT_AMBULATORY_CARE_PROVIDER_SITE_OTHER): Payer: BC Managed Care – PPO | Admitting: *Deleted

## 2020-02-25 DIAGNOSIS — I6300 Cerebral infarction due to thrombosis of unspecified precerebral artery: Secondary | ICD-10-CM

## 2020-02-27 NOTE — Progress Notes (Signed)
Carelink Summary Report / Loop Recorder 

## 2020-03-21 LAB — CUP PACEART REMOTE DEVICE CHECK
Date Time Interrogation Session: 20211007015911
Implantable Pulse Generator Implant Date: 20191028

## 2020-03-31 ENCOUNTER — Ambulatory Visit (INDEPENDENT_AMBULATORY_CARE_PROVIDER_SITE_OTHER): Payer: BC Managed Care – PPO

## 2020-03-31 DIAGNOSIS — I6300 Cerebral infarction due to thrombosis of unspecified precerebral artery: Secondary | ICD-10-CM

## 2020-04-02 NOTE — Progress Notes (Signed)
Carelink Summary Report / Loop Recorder 

## 2020-04-21 ENCOUNTER — Ambulatory Visit: Payer: BC Managed Care – PPO | Admitting: Neurology

## 2020-05-05 ENCOUNTER — Ambulatory Visit (INDEPENDENT_AMBULATORY_CARE_PROVIDER_SITE_OTHER): Payer: BC Managed Care – PPO

## 2020-05-05 DIAGNOSIS — I6300 Cerebral infarction due to thrombosis of unspecified precerebral artery: Secondary | ICD-10-CM

## 2020-05-05 LAB — CUP PACEART REMOTE DEVICE CHECK
Date Time Interrogation Session: 20211121233627
Implantable Pulse Generator Implant Date: 20191028

## 2020-05-06 NOTE — Progress Notes (Signed)
Carelink Summary Report / Loop Recorder 

## 2020-05-26 ENCOUNTER — Telehealth: Payer: Self-pay

## 2020-05-26 NOTE — Telephone Encounter (Signed)
LMOVM for patient to call us to let us know if she wants to transfer to [facility] in [place].

## 2020-05-27 ENCOUNTER — Telehealth: Payer: Self-pay

## 2020-05-27 NOTE — Telephone Encounter (Signed)
° ° °  Pt is returning call, she said she is giving permission to transfer her home pacer check

## 2020-05-27 NOTE — Telephone Encounter (Signed)
I let the patient know that we released her in [facility] to [facility] cardiology. I also cancelled her upcoming appointments in Epic and marked her inactive in paceart.
